# Patient Record
Sex: Female | Born: 1995 | Hispanic: Yes | Marital: Married | Smoking: Never smoker
Health system: Southern US, Community
[De-identification: ages and names within clinical notes are randomized; demographics above are authoritative.]

## PROBLEM LIST (undated history)

## (undated) DIAGNOSIS — O139 Gestational [pregnancy-induced] hypertension without significant proteinuria, unspecified trimester: Secondary | ICD-10-CM

## (undated) DIAGNOSIS — Z789 Other specified health status: Secondary | ICD-10-CM

## (undated) DIAGNOSIS — O0930 Supervision of pregnancy with insufficient antenatal care, unspecified trimester: Secondary | ICD-10-CM

## (undated) HISTORY — DX: Other specified health status: Z78.9

## (undated) HISTORY — PX: NO PAST SURGERIES: SHX2092

---
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
## 2017-01-02 ENCOUNTER — Encounter: Payer: Self-pay | Admitting: Women's Health

## 2017-01-02 ENCOUNTER — Inpatient Hospital Stay (HOSPITAL_COMMUNITY)
Admission: RE | Admit: 2017-01-02 | Discharge: 2017-01-06 | DRG: 774 | Disposition: A | Discharge status: Home / Self Care | Payer: Medicaid Other | Source: Ambulatory Visit | Attending: Family Medicine | Admitting: Family Medicine

## 2017-01-02 ENCOUNTER — Encounter (HOSPITAL_COMMUNITY): Payer: Self-pay | Admitting: *Deleted

## 2017-01-02 ENCOUNTER — Ambulatory Visit (INDEPENDENT_AMBULATORY_CARE_PROVIDER_SITE_OTHER): Payer: Medicaid Other | Admitting: Women's Health

## 2017-01-02 VITALS — BP 136/90 | HR 90 | Wt 241.0 lb

## 2017-01-02 DIAGNOSIS — O0930 Supervision of pregnancy with insufficient antenatal care, unspecified trimester: Secondary | ICD-10-CM

## 2017-01-02 DIAGNOSIS — Z8249 Family history of ischemic heart disease and other diseases of the circulatory system: Secondary | ICD-10-CM

## 2017-01-02 DIAGNOSIS — O135 Gestational [pregnancy-induced] hypertension without significant proteinuria, complicating the puerperium: Secondary | ICD-10-CM | POA: Diagnosis not present

## 2017-01-02 DIAGNOSIS — O134 Gestational [pregnancy-induced] hypertension without significant proteinuria, complicating childbirth: Principal | ICD-10-CM | POA: Diagnosis present

## 2017-01-02 DIAGNOSIS — Z68.41 Body mass index (BMI) pediatric, 85th percentile to less than 95th percentile for age: Secondary | ICD-10-CM | POA: Diagnosis not present

## 2017-01-02 DIAGNOSIS — Z87891 Personal history of nicotine dependence: Secondary | ICD-10-CM | POA: Diagnosis not present

## 2017-01-02 DIAGNOSIS — Z1389 Encounter for screening for other disorder: Secondary | ICD-10-CM

## 2017-01-02 DIAGNOSIS — O139 Gestational [pregnancy-induced] hypertension without significant proteinuria, unspecified trimester: Secondary | ICD-10-CM | POA: Diagnosis present

## 2017-01-02 DIAGNOSIS — Z3403 Encounter for supervision of normal first pregnancy, third trimester: Secondary | ICD-10-CM

## 2017-01-02 DIAGNOSIS — O99214 Obesity complicating childbirth: Secondary | ICD-10-CM | POA: Diagnosis present

## 2017-01-02 DIAGNOSIS — Z3490 Encounter for supervision of normal pregnancy, unspecified, unspecified trimester: Secondary | ICD-10-CM | POA: Diagnosis present

## 2017-01-02 DIAGNOSIS — Z833 Family history of diabetes mellitus: Secondary | ICD-10-CM

## 2017-01-02 DIAGNOSIS — R030 Elevated blood-pressure reading, without diagnosis of hypertension: Secondary | ICD-10-CM

## 2017-01-02 DIAGNOSIS — Z3A40 40 weeks gestation of pregnancy: Secondary | ICD-10-CM

## 2017-01-02 DIAGNOSIS — R079 Chest pain, unspecified: Secondary | ICD-10-CM

## 2017-01-02 DIAGNOSIS — O133 Gestational [pregnancy-induced] hypertension without significant proteinuria, third trimester: Secondary | ICD-10-CM | POA: Diagnosis not present

## 2017-01-02 DIAGNOSIS — Z331 Pregnant state, incidental: Secondary | ICD-10-CM

## 2017-01-02 HISTORY — DX: Supervision of pregnancy with insufficient antenatal care, unspecified trimester: O09.30

## 2017-01-02 HISTORY — DX: Gestational (pregnancy-induced) hypertension without significant proteinuria, unspecified trimester: O13.9

## 2017-01-02 LAB — POCT URINALYSIS DIPSTICK
GLUCOSE UA: NEGATIVE
KETONES UA: NEGATIVE
Leukocytes, UA: NEGATIVE
NITRITE UA: NEGATIVE

## 2017-01-02 LAB — CBC
HCT: 33.3 % — ABNORMAL LOW (ref 36.0–46.0)
Hemoglobin: 11.2 g/dL — ABNORMAL LOW (ref 12.0–15.0)
MCH: 28.6 pg (ref 26.0–34.0)
MCHC: 33.6 g/dL (ref 30.0–36.0)
MCV: 85.2 fL (ref 78.0–100.0)
Platelets: 236 10*3/uL (ref 150–400)
RBC: 3.91 MIL/uL (ref 3.87–5.11)
RDW: 13.7 % (ref 11.5–15.5)
WBC: 8.2 10*3/uL (ref 4.0–10.5)

## 2017-01-02 LAB — COMPREHENSIVE METABOLIC PANEL
ALBUMIN: 2.6 g/dL — AB (ref 3.5–5.0)
ALK PHOS: 213 U/L — AB (ref 38–126)
ALT: 9 U/L — AB (ref 14–54)
AST: 18 U/L (ref 15–41)
Anion gap: 6 (ref 5–15)
BILIRUBIN TOTAL: 0.5 mg/dL (ref 0.3–1.2)
BUN: 7 mg/dL (ref 6–20)
CO2: 24 mmol/L (ref 22–32)
Calcium: 8.9 mg/dL (ref 8.9–10.3)
Chloride: 106 mmol/L (ref 101–111)
Creatinine, Ser: 0.54 mg/dL (ref 0.44–1.00)
GFR calc Af Amer: >60 mL/min (ref >60)
GFR calc non Af Amer: >60 mL/min (ref >60)
Glucose, Bld: 74 mg/dL (ref 65–99)
Potassium: 3.9 mmol/L (ref 3.5–5.1)
SODIUM: 136 mmol/L (ref 135–145)
Total Protein: 6.1 g/dL — ABNORMAL LOW (ref 6.5–8.1)

## 2017-01-02 LAB — PROTEIN / CREATININE RATIO, URINE
Creatinine, Urine: 118 mg/dL
Protein Creatinine Ratio: 4.35 mg/mg{Cre} — ABNORMAL HIGH (ref 0.00–0.15)
Total Protein, Urine: 513 mg/dL

## 2017-01-02 MED ORDER — MISOPROSTOL 25 MCG QUARTER TABLET
25.0000 ug | ORAL_TABLET | ORAL | Status: DC | PRN
  Administered 2017-01-02: 25 ug via ORAL
  Filled 2017-01-02: qty 1

## 2017-01-02 MED ORDER — OXYTOCIN 40 UNITS IN LACTATED RINGERS INFUSION - SIMPLE MED
2.5000 [IU]/h | INTRAVENOUS | Status: DC
  Filled 2017-01-02: qty 1000

## 2017-01-02 MED ORDER — TERBUTALINE SULFATE 1 MG/ML IJ SOLN
0.2500 mg | Freq: Once | INTRAMUSCULAR | Status: DC | PRN
  Filled 2017-01-02: qty 1

## 2017-01-02 MED ORDER — SOD CITRATE-CITRIC ACID 500-334 MG/5ML PO SOLN: 30.0000 mL | ORAL | Status: DC | PRN

## 2017-01-02 MED ORDER — OXYCODONE-ACETAMINOPHEN 5-325 MG PO TABS: 1.0000 | ORAL_TABLET | ORAL | Status: DC | PRN

## 2017-01-02 MED ORDER — LIDOCAINE HCL (PF) 1 % IJ SOLN
30.0000 mL | INTRAMUSCULAR | Status: AC | PRN
  Administered 2017-01-04: 30 mL via SUBCUTANEOUS
  Filled 2017-01-02: qty 30

## 2017-01-02 MED ORDER — FENTANYL CITRATE (PF) 100 MCG/2ML IJ SOLN
100.0000 ug | INTRAMUSCULAR | Status: DC | PRN
  Administered 2017-01-03 – 2017-01-04 (×2): 100 ug via INTRAVENOUS
  Filled 2017-01-02 (×2): qty 2

## 2017-01-02 MED ORDER — LACTATED RINGERS IV SOLN
INTRAVENOUS | Status: DC
  Administered 2017-01-02 – 2017-01-03 (×2): via INTRAVENOUS

## 2017-01-02 MED ORDER — MISOPROSTOL 50MCG HALF TABLET: 50.0000 ug | ORAL_TABLET | ORAL | Status: DC | PRN

## 2017-01-02 MED ORDER — ACETAMINOPHEN 325 MG PO TABS
650.0000 mg | ORAL_TABLET | ORAL | Status: DC | PRN
  Administered 2017-01-04: 650 mg via ORAL
  Filled 2017-01-02: qty 2

## 2017-01-02 MED ORDER — LACTATED RINGERS IV SOLN: 500.0000 mL | INTRAVENOUS | Status: DC | PRN

## 2017-01-02 MED ORDER — OXYCODONE-ACETAMINOPHEN 5-325 MG PO TABS: 2.0000 | ORAL_TABLET | ORAL | Status: DC | PRN

## 2017-01-02 MED ORDER — LACTATED RINGERS IV SOLN
INTRAVENOUS | Status: DC
  Administered 2017-01-03 (×2): via INTRAVENOUS

## 2017-01-02 MED ORDER — OXYTOCIN BOLUS FROM INFUSION
500.0000 mL | Freq: Once | INTRAVENOUS | Status: AC
  Administered 2017-01-04: 500 mL via INTRAVENOUS

## 2017-01-02 MED ORDER — ONDANSETRON HCL 4 MG/2ML IJ SOLN: 4.0000 mg | Freq: Four times a day (QID) | INTRAMUSCULAR | Status: DC | PRN

## 2017-01-02 NOTE — H&P (Signed)
Cindy Middleton is a 21 y.o. female presenting for induction of labor for gestational hypertension . Office note: G1P0 [redacted]w[redacted]d Estimated Date of Delivery: 01/02/17 BP 136/90   Pulse 90   Wt 241 lb (109.3 kg)   LMP 03/28/2016   BMI 42.69 kg/m   BP, weight, and urine reviewed.  Refer to obstetrical flow sheet for FH & FHR.  Reports good fm.  Denies regular uc's, lof, vb, or uti s/s. No complaints. Denies ha, visual changes, ruq/epigastric pain, n/v.   18lb wt gain in last 11 days, BP elevated today @ 136/90, 4+ proteinuria, 3+ BLE edema, DTRs 2+, no clonus Declines SVE Pt to go straight to [facility] for IOL d/t probable pre-e. Notified resident, L&D charge  OB History    Gravida Para Term Preterm AB Living   1             SAB TAB Ectopic Multiple Live Births                 Past Medical History:  Diagnosis Date  . Medical history non-contributory    Past Surgical History:  Procedure Laterality Date  . NO PAST SURGERIES     Family History: family history includes Diabetes in her maternal grandmother and mother; Hypertension in her maternal grandmother. Social History:  reports that she has quit smoking. She has never used smokeless tobacco. She reports that she does not drink alcohol or use drugs.     Maternal Diabetes: No Genetic Screening: Declined Maternal Ultrasounds/Referrals: Normal Fetal Ultrasounds or other Referrals:  None Maternal Substance Abuse:  No Significant Maternal Medications:  None Significant Maternal Lab Results:  Lab values include: Group B Strep negative Other Comments:  None  Review of Systems  Constitutional: Negative for chills, fever and malaise/fatigue.  Eyes: Negative for blurred vision.  Respiratory: Negative for shortness of breath.   Cardiovascular: Positive for leg swelling.  Gastrointestinal: Negative for abdominal pain, constipation, diarrhea, nausea and vomiting.  Neurological: Negative for dizziness, sensory change, speech change and focal  weakness.   Maternal Medical History:  Reason for admission: Nausea. Hypertension, new   Contractions: Frequency: irregular.   Perceived severity is mild.    Fetal activity: Perceived fetal activity is normal.   Last perceived fetal movement was within the past hour.    Prenatal complications: PIH.   No bleeding, preterm labor or substance abuse.   Prenatal Complications - Diabetes: none.    Dilation: Closed Effacement (%): Thick Station: -3 Exam by:: Lytle Michaels RN Blood pressure 133/70, pulse 87, temperature 99 F (37.2 C), temperature source Oral, resp. rate 18, height 5' 3.5" (1.613 m), weight 241 lb (109.3 kg), last menstrual period 03/28/2016. Maternal Exam:  Uterine Assessment: Contraction strength is mild.  Contraction frequency is irregular.   Abdomen: Patient reports no abdominal tenderness. Fundal height is 39.   Estimated fetal weight is 7.5.   Fetal presentation: vertex  Introitus: Normal vulva. Normal vagina.  Ferning test: not done.  Nitrazine test: not done. Amniotic fluid character: not assessed.  Pelvis: adequate for delivery.   Cervix: Cervix evaluated by digital exam.     Fetal Exam Fetal Monitor Review: Mode: ultrasound.   Baseline rate: 135.  Variability: moderate (6-25 bpm).   Pattern: accelerations present and no decelerations.    Fetal State Assessment: Category I - tracings are normal.     Physical Exam  Constitutional: She is oriented to person, place, and time. She appears well-developed and well-nourished. No distress.  HENT:  Head: Normocephalic.  Cardiovascular: Normal rate and regular rhythm.   Respiratory: Effort normal. No respiratory distress. She has no wheezes. She has no rales.  GI: Soft. She exhibits no distension. There is no tenderness. There is no rebound and no guarding.  Genitourinary:  Genitourinary Comments: Dilation: Closed Effacement (%): 40 Cervical Position: Posterior Station: -3,  Ballotable Presentation: Vertex Exam by:: Wynelle BourgeoisMarie Anahi Belmar, CNM   Musculoskeletal: She exhibits edema.  Neurological: She is alert and oriented to person, place, and time. She displays normal reflexes. She exhibits normal muscle tone.  Skin: Skin is warm and dry.  Psychiatric: She has a normal mood and affect.    Prenatal labs: ABO, Rh: --/--/O POS (05/07 1911) Antibody: NEG (05/07 1911) Rubella: 10.10 (01/02 1626) RPR: Non Reactive (02/13 0906)  HBsAg: Negative (01/02 1626)  HIV: Non Reactive (02/13 0906)  GBS: Negative (04/18 1230)   Assessment/Plan: Single IUP at 7530w0d Gestational hypertension  [facility] Routine orders Cytotec for cervical ripening Bishop score = 2    Wynelle BourgeoisMarie Constant [place] 01/02/2017, 9:30 PM

## 2017-01-02 NOTE — Progress Notes (Signed)
Low-risk OB appointment G1P0 2823w0d Estimated Date of Delivery: 01/02/17 BP 136/90   Pulse 90   Wt 241 lb (109.3 kg)   LMP 03/28/2016   BMI 42.69 kg/m   BP, weight, and urine reviewed.  Refer to obstetrical flow sheet for FH & FHR.  Reports good fm.  Denies regular uc's, lof, vb, or uti s/s. No complaints. Denies ha, visual changes, ruq/epigastric pain, n/v.   18lb wt gain in last 11 days, BP elevated today @ 136/90, 4+ proteinuria, 3+ BLE edema, DTRs 2+, no clonus Declines SVE [facility] probable pre-e. Notified resident, L&D charge.  F/U in 1wk for pp bp check, then 4-6wks for pp visit, order nexplanon today

## 2017-01-02 NOTE — Anesthesia Pain Management Evaluation Note (Signed)
  CRNA Pain Management Visit Note  Patient: Cindy Middleton, 21 y.o., female  "Hello [facility]. We have an anesthesia team available at all times to provide care throughout the hospital, including epidural management and anesthesia for C-section. I don't know your plan for the delivery whether it a natural birth, water birth, IV sedation, nitrous supplementation, doula or epidural, but we want to meet your pain goals."   1.Was your pain managed to your expectations on prior hospitalizations?   No prior hospitalizations  2.What is your expectation for pain management during this hospitalization?     IV pain meds  3.How can we help you reach that goal?   Record the patient's initial score and the patient's pain goal.   Pain: 0  Pain Goal: 10 [facility] you to be able to say your pain was always managed very well.  Cindy Middleton,Cindy Middleton 01/02/2017

## 2017-01-03 ENCOUNTER — Inpatient Hospital Stay (HOSPITAL_COMMUNITY): Payer: Medicaid Other | Admitting: Anesthesiology

## 2017-01-03 LAB — RPR: RPR: NONREACTIVE

## 2017-01-03 LAB — CBC
HCT: 34 % — ABNORMAL LOW (ref 36.0–46.0)
Hemoglobin: 11.3 g/dL — ABNORMAL LOW (ref 12.0–15.0)
MCH: 28.3 pg (ref 26.0–34.0)
MCHC: 33.2 g/dL (ref 30.0–36.0)
MCV: 85 fL (ref 78.0–100.0)
Platelets: 224 10*3/uL (ref 150–400)
RBC: 4 MIL/uL (ref 3.87–5.11)
RDW: 13.6 % (ref 11.5–15.5)
WBC: 10.4 10*3/uL (ref 4.0–10.5)

## 2017-01-03 LAB — ABO/RH: ABO/RH(D): O POS

## 2017-01-03 MED ORDER — LACTATED RINGERS IV SOLN
500.0000 mL | Freq: Once | INTRAVENOUS | Status: AC
  Administered 2017-01-03: 500 mL via INTRAVENOUS

## 2017-01-03 MED ORDER — FENTANYL 2.5 MCG/ML BUPIVACAINE 1/10 % EPIDURAL INFUSION (WH - ANES)
14.0000 mL/h | INTRAMUSCULAR | Status: DC | PRN
  Administered 2017-01-03 – 2017-01-04 (×3): 14 mL/h via EPIDURAL
  Filled 2017-01-03 (×3): qty 100

## 2017-01-03 MED ORDER — LACTATED RINGERS IV SOLN: 500.0000 mL | Freq: Once | INTRAVENOUS | Status: AC

## 2017-01-03 MED ORDER — PHENYLEPHRINE 40 MCG/ML (10ML) SYRINGE FOR IV PUSH (FOR BLOOD PRESSURE SUPPORT)
80.0000 ug | PREFILLED_SYRINGE | INTRAVENOUS | Status: DC | PRN
  Filled 2017-01-03: qty 5
  Filled 2017-01-03: qty 10

## 2017-01-03 MED ORDER — PHENYLEPHRINE 40 MCG/ML (10ML) SYRINGE FOR IV PUSH (FOR BLOOD PRESSURE SUPPORT)
80.0000 ug | PREFILLED_SYRINGE | INTRAVENOUS | Status: DC | PRN
  Filled 2017-01-03: qty 5

## 2017-01-03 MED ORDER — EPHEDRINE 5 MG/ML INJ
10.0000 mg | INTRAVENOUS | Status: DC | PRN
  Filled 2017-01-03: qty 2

## 2017-01-03 MED ORDER — LIDOCAINE HCL (PF) 1 % IJ SOLN
INTRAMUSCULAR | Status: DC | PRN
  Administered 2017-01-03 (×2): 6 mL via EPIDURAL

## 2017-01-03 MED ORDER — MISOPROSTOL 50MCG HALF TABLET
50.0000 ug | ORAL_TABLET | ORAL | Status: DC | PRN
  Administered 2017-01-03 (×2): 50 ug via ORAL
  Filled 2017-01-03 (×3): qty 1

## 2017-01-03 MED ORDER — TERBUTALINE SULFATE 1 MG/ML IJ SOLN
0.2500 mg | Freq: Once | INTRAMUSCULAR | Status: DC | PRN
  Filled 2017-01-03: qty 1

## 2017-01-03 MED ORDER — DIPHENHYDRAMINE HCL 50 MG/ML IJ SOLN: 12.5000 mg | INTRAMUSCULAR | Status: DC | PRN

## 2017-01-03 MED ORDER — OXYTOCIN 40 UNITS IN LACTATED RINGERS INFUSION - SIMPLE MED
1.0000 m[IU]/min | INTRAVENOUS | Status: DC
  Administered 2017-01-03: 2 m[IU]/min via INTRAVENOUS
  Filled 2017-01-03: qty 1000

## 2017-01-03 MED ORDER — MISOPROSTOL 25 MCG QUARTER TABLET
25.0000 ug | ORAL_TABLET | ORAL | Status: DC | PRN
  Administered 2017-01-03: 25 ug via VAGINAL
  Filled 2017-01-03 (×2): qty 1

## 2017-01-03 NOTE — Anesthesia Preprocedure Evaluation (Addendum)
Anesthesia Evaluation  Patient identified by MRN, date of birth, ID band Patient awake    Reviewed: Allergy & Precautions, NPO status , Patient's Chart, lab work & pertinent test results  History of Anesthesia Complications Negative for: history of anesthetic complications  Airway Mallampati: IV  TM Distance: >3 FB Neck ROM: Full    Dental  (+) Dental Advisory Given   Pulmonary neg pulmonary ROS, former smoker,    breath sounds clear to auscultation       Cardiovascular hypertension (PIH),  Rhythm:Regular Rate:Normal     Neuro/Psych negative neurological ROS     GI/Hepatic GERD  ,Elevated alk phos   Endo/Other  Morbid obesity  Renal/GU negative Renal ROS     Musculoskeletal   Abdominal   Peds  Hematology negative hematology ROS (+) plt 224k   Anesthesia Other Findings   Reproductive/Obstetrics (+) Pregnancy                            Anesthesia Physical Anesthesia Plan  ASA: III  Anesthesia Plan: Epidural   Post-op Pain Management:    Induction:   Airway Management Planned: Natural Airway  Additional Equipment:   Intra-op Plan:   Post-operative Plan:   Informed Consent: I have reviewed the patients History and Physical, chart, labs and discussed the procedure including the risks, benefits and alternatives for the proposed anesthesia with the patient or authorized representative who has indicated his/her understanding and acceptance.   Dental advisory given  Plan Discussed with:   Anesthesia Plan Comments: (Patient identified. Risks/Benefits/Options discussed with patient including but not limited to bleeding, infection, nerve damage, paralysis, failed block, incomplete pain control, headache, blood pressure changes, nausea, vomiting, reactions to medication both or allergic, itching and postpartum back pain. Confirmed with bedside nurse the patient's most recent platelet  count. Confirmed with patient that they are not currently taking any anticoagulation, have any bleeding history or any family history of bleeding disorders. Patient expressed understanding and wished to proceed. All questions were answered. )        Anesthesia Quick Evaluation

## 2017-01-03 NOTE — Progress Notes (Signed)
Report received from Sandrea HughsKatie Forsell at 4:40 am.

## 2017-01-03 NOTE — Anesthesia Procedure Notes (Signed)
Epidural Patient location during procedure: OB Start time: 01/03/2017 3:41 PM End time: 01/03/2017 3:58 PM  Staffing Anesthesiologist: Jairo BenJACKSON, Leman Martinek Performed: anesthesiologist   Preanesthetic Checklist Completed: patient identified, surgical consent, pre-op evaluation, timeout performed, IV checked, risks and benefits discussed and monitors and equipment checked  Epidural Patient position: sitting Prep: site prepped and draped and DuraPrep Patient monitoring: blood pressure, continuous pulse ox and heart rate Approach: midline Location: L3-L4 Injection technique: LOR air  Needle:  Needle type: Tuohy  Needle gauge: 17 G Needle length: 9 cm Needle insertion depth: 5 cm Catheter type: closed end flexible Catheter size: 19 Gauge Catheter at skin depth: 10.5 cm Test dose: negative (1% lidocaine)  Assessment Events: blood not aspirated, injection not painful, no injection resistance, negative IV test and no paresthesia  Additional Notes Pt identified in Labor room.  Monitors applied. Working IV access confirmed. Sterile prep, drape lumbar spine.  1% lido local L 3,4.  #17ga Touhy LOR air at 5 cm L 3,4, cath in easily to 10.5 cm skin. Test dose OK, cath dosed and infusion begun.  Patient asymptomatic, VSS, no heme aspirated, tolerated well.  Sandford Craze Richar Dunklee, MDReason for block:procedure for pain

## 2017-01-03 NOTE — Progress Notes (Signed)
Cindy Middleton is a 21 y.o. G1P0 at 3540w1d  admitted for induction of labor due to preeclampsia.  Subjective: Patient comfortable with epidural. Patient reports feeling some pressure. Denies any headaches, visual changes or right upper quadrant pain.   Objective: Vitals:   01/03/17 2040 01/03/17 2101 01/03/17 2131 01/03/17 2201  BP: 132/89 (!) 145/92 (!) 149/97 129/82  Pulse: (!) 110 99 100 (!) 108  Resp: 18 20 18 18   Temp:  99.3 F (37.4 C)    TempSrc:  Oral    SpO2:      Weight:      Height:       Total I/O In: 250 [I.V.:250] Out: -   FHT:  FHR: 140s bpm, variability: moderate,  accelerations:  Present,  decelerations:  Absent UC:   regular, every 3-5 minutes SVE:   Dilation: 6 Effacement (%): 90 Station: 0 Exam by:: Straight RN Pitocin @ 6 mu/min  Labs: Lab Results  Component Value Date   WBC 10.4 01/03/2017   HGB 11.3 (L) 01/03/2017   HCT 34.0 (L) 01/03/2017   MCV 85.0 01/03/2017   PLT 224 01/03/2017    Assessment / Plan: Induction of labor due to preeclampsia,  progressing well on pitocin. Will recheck in 2 hours.   Labor: progressing on pitocin Fetal Wellbeing:  Category I Pain Control:  Epidural Anticipated MOD:  NSVD   Cleone SlimCaroline Deon Duer SNM 01/03/2017, 10:34 PM

## 2017-01-03 NOTE — Progress Notes (Signed)
Patient ID: Cindy Middleton, female   DOB: 07/15/1996, 21 y.o.   MRN: [id number] Vitals:   01/02/17 2102 01/02/17 2200 01/02/17 2321 01/03/17 0009  BP: 133/70 (!) 144/86 (!) 147/88 140/77  Pulse: 87 81 87 83  Resp: 18 18 18 18   Temp:  97.6 F (36.4 C)    TempSrc:  Oral    Weight:      Height:        Results for orders placed or performed during the hospital encounter of 01/02/17 (from the past 24 hour(s))  CBC     Status: Abnormal   Collection Time: 01/02/17  7:11 PM  Result Value Ref Range   WBC 8.2 4.0 - 10.5 K/uL   RBC 3.91 3.87 - 5.11 MIL/uL   Hemoglobin 11.2 (L) 12.0 - 15.0 g/dL   HCT 40.933.3 (L) 81.136.0 - 91.446.0 %   MCV 85.2 78.0 - 100.0 fL   MCH 28.6 26.0 - 34.0 pg   MCHC 33.6 30.0 - 36.0 g/dL   RDW 78.213.7 95.611.5 - 21.315.5 %   Platelets 236 150 - 400 K/uL  Type and screen [facility]     Status: None   Collection Time: 01/02/17  7:11 PM  Result Value Ref Range   ABO/RH(D) O POS    Antibody Screen NEG    Sample Expiration 01/05/2017   Comprehensive metabolic panel     Status: Abnormal   Collection Time: 01/02/17  7:11 PM  Result Value Ref Range   Sodium 136 135 - 145 mmol/L   Potassium 3.9 3.5 - 5.1 mmol/L   Chloride 106 101 - 111 mmol/L   CO2 24 22 - 32 mmol/L   Glucose, Bld 74 65 - 99 mg/dL   BUN 7 6 - 20 mg/dL   Creatinine, Ser 0.860.54 0.44 - 1.00 mg/dL   Calcium 8.9 8.9 - 57.810.3 mg/dL   Total Protein 6.1 (L) 6.5 - 8.1 g/dL   Albumin 2.6 (L) 3.5 - 5.0 g/dL   AST 18 15 - 41 U/L   ALT 9 (L) 14 - 54 U/L   Alkaline Phosphatase 213 (H) 38 - 126 U/L   Total Bilirubin 0.5 0.3 - 1.2 mg/dL   GFR calc non Af Amer >60 >60 mL/min   GFR calc Af Amer >60 >60 mL/min   Anion gap 6 5 - 15  Protein / creatinine ratio, urine     Status: Abnormal   Collection Time: 01/02/17  9:50 PM  Result Value Ref Range   Creatinine, Urine 118.00 mg/dL   Total Protein, Urine 513 mg/dL   Protein Creatinine Ratio 4.35 (H) 0.00 - 0.15 mg/mg[Cre]    FHR reactive UCs not tracing  many  Dilation: Closed Effacement (%): 40 Cervical Position: Posterior Station: -3, Ballotable Presentation: Vertex Exam by:: Wynelle BourgeoisMarie Kaitlynn Tramontana, CNM  Will continue cytotec

## 2017-01-04 ENCOUNTER — Inpatient Hospital Stay (HOSPITAL_COMMUNITY): Payer: Medicaid Other

## 2017-01-04 DIAGNOSIS — O135 Gestational [pregnancy-induced] hypertension without significant proteinuria, complicating the puerperium: Secondary | ICD-10-CM

## 2017-01-04 DIAGNOSIS — Z3A40 40 weeks gestation of pregnancy: Secondary | ICD-10-CM

## 2017-01-04 LAB — CBC
HCT: 29 % — ABNORMAL LOW (ref 36.0–46.0)
HCT: 33.4 % — ABNORMAL LOW (ref 36.0–46.0)
HCT: 37.8 % (ref 36.0–46.0)
Hemoglobin: 10 g/dL — ABNORMAL LOW (ref 12.0–15.0)
Hemoglobin: 11.4 g/dL — ABNORMAL LOW (ref 12.0–15.0)
Hemoglobin: 12.8 g/dL (ref 12.0–15.0)
MCH: 28.6 pg (ref 26.0–34.0)
MCH: 28.6 pg (ref 26.0–34.0)
MCH: 29 pg (ref 26.0–34.0)
MCHC: 33.9 g/dL (ref 30.0–36.0)
MCHC: 34.1 g/dL (ref 30.0–36.0)
MCHC: 34.5 g/dL (ref 30.0–36.0)
MCV: 83.9 fL (ref 78.0–100.0)
MCV: 84.1 fL (ref 78.0–100.0)
MCV: 84.4 fL (ref 78.0–100.0)
PLATELETS: 198 10*3/uL (ref 150–400)
PLATELETS: 227 10*3/uL (ref 150–400)
Platelets: 213 10*3/uL (ref 150–400)
RBC: 3.45 MIL/uL — AB (ref 3.87–5.11)
RBC: 3.98 MIL/uL (ref 3.87–5.11)
RBC: 4.48 MIL/uL (ref 3.87–5.11)
RDW: 13.6 % (ref 11.5–15.5)
RDW: 13.7 % (ref 11.5–15.5)
RDW: 13.8 % (ref 11.5–15.5)
WBC: 13.9 10*3/uL — AB (ref 4.0–10.5)
WBC: 24.9 10*3/uL — ABNORMAL HIGH (ref 4.0–10.5)
WBC: 26.2 10*3/uL — AB (ref 4.0–10.5)

## 2017-01-04 LAB — COMPREHENSIVE METABOLIC PANEL
ALBUMIN: 2.7 g/dL — AB (ref 3.5–5.0)
ALT: 9 U/L — ABNORMAL LOW (ref 14–54)
ANION GAP: 10 (ref 5–15)
AST: 18 U/L (ref 15–41)
Alkaline Phosphatase: 234 U/L — ABNORMAL HIGH (ref 38–126)
BUN: 7 mg/dL (ref 6–20)
CHLORIDE: 105 mmol/L (ref 101–111)
CO2: 22 mmol/L (ref 22–32)
Calcium: 8.5 mg/dL — ABNORMAL LOW (ref 8.9–10.3)
Creatinine, Ser: 0.57 mg/dL (ref 0.44–1.00)
GFR calc Af Amer: >60 mL/min (ref >60)
GFR calc non Af Amer: >60 mL/min (ref >60)
GLUCOSE: 80 mg/dL (ref 65–99)
POTASSIUM: 3.6 mmol/L (ref 3.5–5.1)
SODIUM: 137 mmol/L (ref 135–145)
TOTAL PROTEIN: 6.3 g/dL — AB (ref 6.5–8.1)
Total Bilirubin: 1.4 mg/dL — ABNORMAL HIGH (ref 0.3–1.2)

## 2017-01-04 LAB — DIC (DISSEMINATED INTRAVASCULAR COAGULATION) PANEL
APTT: 34 s (ref 24–36)
D DIMER QUANT: 3.54 ug{FEU}/mL — AB (ref 0.00–0.50)
FIBRINOGEN: 541 mg/dL — AB (ref 210–475)
INR: 1.25
PLATELETS: 221 10*3/uL (ref 150–400)
SMEAR REVIEW: NONE SEEN

## 2017-01-04 LAB — DIC (DISSEMINATED INTRAVASCULAR COAGULATION)PANEL: Prothrombin Time: 15.8 seconds — ABNORMAL HIGH (ref 11.4–15.2)

## 2017-01-04 LAB — MASSIVE TRANSFUSION PROTOCOL ORDER (BLOOD BANK NOTIFICATION)

## 2017-01-04 MED ORDER — FENTANYL CITRATE (PF) 100 MCG/2ML IJ SOLN
INTRAMUSCULAR | Status: AC
  Administered 2017-01-04: 100 ug via INTRAVENOUS
  Filled 2017-01-04: qty 4

## 2017-01-04 MED ORDER — TETANUS-DIPHTH-ACELL PERTUSSIS 5-2.5-18.5 LF-MCG/0.5 IM SUSP: 0.5000 mL | Freq: Once | INTRAMUSCULAR | Status: DC

## 2017-01-04 MED ORDER — DIPHENHYDRAMINE HCL 50 MG/ML IJ SOLN
INTRAMUSCULAR | Status: AC
  Filled 2017-01-04: qty 1

## 2017-01-04 MED ORDER — MISOPROSTOL 200 MCG PO TABS: 800.0000 ug | ORAL_TABLET | Freq: Once | ORAL | Status: DC

## 2017-01-04 MED ORDER — PRENATAL MULTIVITAMIN CH
1.0000 | ORAL_TABLET | Freq: Every day | ORAL | Status: DC
  Administered 2017-01-05: 1 via ORAL
  Filled 2017-01-04: qty 1

## 2017-01-04 MED ORDER — ACETAMINOPHEN 325 MG PO TABS: 650.0000 mg | ORAL_TABLET | ORAL | Status: DC | PRN

## 2017-01-04 MED ORDER — FENTANYL CITRATE (PF) 100 MCG/2ML IJ SOLN
100.0000 ug | Freq: Once | INTRAMUSCULAR | Status: AC
  Administered 2017-01-04: 100 ug via INTRAVENOUS

## 2017-01-04 MED ORDER — SENNOSIDES-DOCUSATE SODIUM 8.6-50 MG PO TABS
2.0000 | ORAL_TABLET | ORAL | Status: DC
  Administered 2017-01-05: 2 via ORAL
  Filled 2017-01-04: qty 2

## 2017-01-04 MED ORDER — MISOPROSTOL 200 MCG PO TABS
ORAL_TABLET | ORAL | Status: AC
  Administered 2017-01-04: 1000 ug
  Filled 2017-01-04: qty 5

## 2017-01-04 MED ORDER — ACETAMINOPHEN 500 MG PO TABS
1000.0000 mg | ORAL_TABLET | Freq: Once | ORAL | Status: AC
  Administered 2017-01-04: 1000 mg via ORAL
  Filled 2017-01-04: qty 2

## 2017-01-04 MED ORDER — WITCH HAZEL-GLYCERIN EX PADS: 1.0000 "application " | MEDICATED_PAD | CUTANEOUS | Status: DC | PRN

## 2017-01-04 MED ORDER — GENTAMICIN SULFATE 40 MG/ML IJ SOLN
160.0000 mg | Freq: Three times a day (TID) | INTRAVENOUS | Status: DC
  Administered 2017-01-04: 160 mg via INTRAVENOUS
  Filled 2017-01-04: qty 4

## 2017-01-04 MED ORDER — ZOLPIDEM TARTRATE 5 MG PO TABS: 5.0000 mg | ORAL_TABLET | Freq: Every evening | ORAL | Status: DC | PRN

## 2017-01-04 MED ORDER — METHYLERGONOVINE MALEATE 0.2 MG/ML IJ SOLN: 0.2000 mg | Freq: Once | INTRAMUSCULAR | Status: DC

## 2017-01-04 MED ORDER — DIPHENHYDRAMINE HCL 25 MG PO CAPS: 25.0000 mg | ORAL_CAPSULE | Freq: Four times a day (QID) | ORAL | Status: DC | PRN

## 2017-01-04 MED ORDER — SOD CITRATE-CITRIC ACID 500-334 MG/5ML PO SOLN
ORAL | Status: AC
  Filled 2017-01-04: qty 15

## 2017-01-04 MED ORDER — SIMETHICONE 80 MG PO CHEW
80.0000 mg | CHEWABLE_TABLET | ORAL | Status: DC | PRN
Start: 2017-01-04 — End: 2017-01-06

## 2017-01-04 MED ORDER — COCONUT OIL OIL: 1.0000 "application " | TOPICAL_OIL | Status: DC | PRN

## 2017-01-04 MED ORDER — OXYTOCIN 40 UNITS IN LACTATED RINGERS INFUSION - SIMPLE MED
INTRAVENOUS | Status: AC
  Filled 2017-01-04: qty 1000

## 2017-01-04 MED ORDER — LACTATED RINGERS IV SOLN
INTRAVENOUS | Status: DC
  Administered 2017-01-04 – 2017-01-05 (×2): via INTRAVENOUS

## 2017-01-04 MED ORDER — OXYTOCIN 40 UNITS IN LACTATED RINGERS INFUSION - SIMPLE MED
10.0000 [IU]/h | INTRAVENOUS | Status: DC
  Administered 2017-01-04: 10 [IU]/h via INTRAVENOUS

## 2017-01-04 MED ORDER — ONDANSETRON HCL 4 MG/2ML IJ SOLN: 4.0000 mg | INTRAMUSCULAR | Status: DC | PRN

## 2017-01-04 MED ORDER — METHYLERGONOVINE MALEATE 0.2 MG/ML IJ SOLN
INTRAMUSCULAR | Status: AC
  Filled 2017-01-04: qty 3

## 2017-01-04 MED ORDER — ACETAMINOPHEN 325 MG PO TABS
325.0000 mg | ORAL_TABLET | Freq: Four times a day (QID) | ORAL | Status: DC | PRN
  Administered 2017-01-04: 325 mg via ORAL
  Filled 2017-01-04 (×2): qty 1

## 2017-01-04 MED ORDER — DIPHENHYDRAMINE HCL 50 MG/ML IJ SOLN
25.0000 mg | Freq: Once | INTRAMUSCULAR | Status: AC
Start: 2017-01-04 — End: 2017-01-04
  Administered 2017-01-04: 25 mg via INTRAVENOUS

## 2017-01-04 MED ORDER — CARBOPROST TROMETHAMINE 250 MCG/ML IM SOLN
INTRAMUSCULAR | Status: AC
  Filled 2017-01-04: qty 1

## 2017-01-04 MED ORDER — SODIUM CHLORIDE 0.9 % IV SOLN
2.0000 g | Freq: Four times a day (QID) | INTRAVENOUS | Status: DC
  Administered 2017-01-04: 2 g via INTRAVENOUS
  Filled 2017-01-04 (×2): qty 2000

## 2017-01-04 MED ORDER — BENZOCAINE-MENTHOL 20-0.5 % EX AERO
1.0000 "application " | INHALATION_SPRAY | CUTANEOUS | Status: DC | PRN
  Filled 2017-01-04: qty 56

## 2017-01-04 MED ORDER — ONDANSETRON HCL 4 MG PO TABS: 4.0000 mg | ORAL_TABLET | ORAL | Status: DC | PRN

## 2017-01-04 MED ORDER — DIBUCAINE 1 % RE OINT: 1.0000 "application " | TOPICAL_OINTMENT | RECTAL | Status: DC | PRN

## 2017-01-04 MED ORDER — SODIUM CHLORIDE 0.9 % IV SOLN
3.0000 g | Freq: Four times a day (QID) | INTRAVENOUS | Status: AC
  Administered 2017-01-04 – 2017-01-05 (×4): 3 g via INTRAVENOUS
  Filled 2017-01-04 (×4): qty 3

## 2017-01-04 MED ORDER — SODIUM CHLORIDE 0.9 % IV SOLN
INTRAVENOUS | Status: DC
  Administered 2017-01-04: 16:00:00 via INTRAVENOUS

## 2017-01-04 MED ORDER — IBUPROFEN 600 MG PO TABS
600.0000 mg | ORAL_TABLET | Freq: Four times a day (QID) | ORAL | Status: DC
  Administered 2017-01-04 – 2017-01-06 (×6): 600 mg via ORAL
  Filled 2017-01-04 (×6): qty 1

## 2017-01-04 NOTE — Progress Notes (Signed)
[facility] delivered via vacuum earlier today. When entered the room, patient had notable blood loss.  Patient was tachycardic to 150, BP was wnl. Patient conversant.   Interventions: Second IV placed  Pitocin bag wide open Cytotec 1000mg  buccal LR at 150  Foley placed with UOP 450ml 2 units of pRBC now, 2u on hold q6 hr CBC   Manual removal of clot:  Manual evacuation of uterus performed, bimanual compression of uterus Unasyn ordered  Federico FlakeKimberly Niles Arabia Nylund, MD, MPH, ABFM Attending Physician Faculty Practice- [facility]

## 2017-01-04 NOTE — Progress Notes (Signed)
Subjective: Patient reporting more pain on the right side. Epidural re-dosed and pain resolved.   Objective: BP 130/73   Pulse (!) 115   Temp 98.3 F (36.8 C) (Axillary)   Resp 18   Ht 5' 3.5" (1.613 m)   Wt 241 lb (109.3 kg)   LMP 03/28/2016   SpO2 99%   BMI 42.02 kg/m  Total I/O In: 750 [I.V.:750] Out: 250 [Urine:250]  FHT:  FHR: 140 bpm, variability: moderate,  accelerations:  Present,  decelerations:  Absent UC:   regular, every 2-3 minutes  SVE:   Dilation: 8.5 Effacement (%): 90 Station: +2 Exam by:: Booker CNM  Pitocin @ 5 mu/min  Labs: Lab Results  Component Value Date   WBC 13.9 (H) 01/04/2017   HGB 12.8 01/04/2017   HCT 37.8 01/04/2017   MCV 84.4 01/04/2017   PLT 227 01/04/2017    Assessment / Plan: IUP at term. IOL for preeclampsia. IUPC placed. Patient having periods of tachysystole, pitocin reduced to half and will go back up.   Fetal Wellbeing:  Category I Pain Control:  Epidural Pre-eclampsia: no signs and symptoms, repeat lab work normal. Anticipated MOD:  NSVD  Cleone SlimCaroline Zakai Gonyea SNM 01/04/2017, 2:14 AM

## 2017-01-04 NOTE — Progress Notes (Signed)
2nd unit of PRBC's started at 1852; vss.  Verified with two RNs; Rada HaySydney Flynt, RN and Vivi MartensAshley Deaun Rocha, RN.  1000 mg Acetaminophen was given orally and IV 50mg  Benadryl was given 45 minutes prior to blood administration.

## 2017-01-04 NOTE — Anesthesia Postprocedure Evaluation (Signed)
Anesthesia Post Note  Patient: Cindy BrazenCarol Middleton  Procedure(s) Performed: * No procedures listed *  Patient location during evaluation: Mother Baby Anesthesia Type: Epidural Level of consciousness: awake Pain management: pain level controlled Vital Signs Assessment: post-procedure vital signs reviewed and stable Respiratory status: spontaneous breathing Cardiovascular status: stable Postop Assessment: no headache, no backache, epidural receding, patient able to bend at knees, no signs of nausea or vomiting and adequate PO intake Anesthetic complications: no        Last Vitals:  Vitals:   01/04/17 1121 01/04/17 1145  BP: 130/68 (!) 142/75  Pulse: (!) 108 (!) 112  Resp: 18 18  Temp:  37.1 C    Last Pain:  Vitals:   01/04/17 1145  TempSrc: Oral  PainSc:    Pain Goal: Patients Stated Pain Goal: 10 (01/03/17 0730)               Tyreece Gelles

## 2017-01-04 NOTE — Progress Notes (Signed)
Pharmacy Antibiotic Note  Cindy BrazenCarol Middleton is a 21 y.o. female admitted on 01/02/2017 with induction of labor.  Pharmacy has been consulted for gentamicin dosing.  Plan: Gentamicin 160 mg IV Q8hr   Height: 5' 3.5" (161.3 cm) Weight: 241 lb (109.3 kg) IBW/kg (Calculated) : 53.55 kg Dosing weight: 70 kg  Temp (24hrs), Avg:99 F (37.2 C), Min:98 F (36.7 C), Max:102 F (38.9 C)   Recent Labs Lab 01/02/17 1911 01/03/17 1444 01/04/17 0033  WBC 8.2 10.4 13.9*  CREATININE 0.54  --  0.57    Estimated Creatinine Clearance: 136.6 mL/min (by C-G formula based on SCr of 0.57 mg/dL).    No Known Allergies  Antimicrobials this admission: Ampicillin 5/9 >>  Gentamicin  5/9 >>    Thank you for allowing pharmacy to be a part of this patient's care.  Natasha BenceCline, Sabriah Hobbins 01/04/2017 8:13 AM

## 2017-01-04 NOTE — Progress Notes (Signed)
Carron BrazenCarol Dudash is a 21 y.o. G1P0 at 5756w2d by LMP admitted for induction of labor due to preeclampsia.  Subjective: Asked to see patient - was having chest pain radiating into back with difficulty breathing. Now completely resolved. Was sleeping when occurred - woke her up. Denies anxiety hx. Did have a low BP during episode, now 140s/90s. CP lasted for a 10-15 minutes. Denies difficulty breathing now.   Objective: BP 105/74 (BP Location: Left Arm)   Pulse (!) 114   Temp 98.3 F (36.8 C) (Axillary)   Resp 18   Ht 5' 3.5" (1.613 m)   Wt 241 lb (109.3 kg)   LMP 03/28/2016   SpO2 99%   BMI 42.02 kg/m  I/O last 3 completed shifts: In: 1056.3 [I.V.:1056.3] Out: 250 [Urine:250] Total I/O In: 625 [I.V.:625] Out: 250 [Urine:250]  FHT:  FHR: 145 bpm, variability: moderate,  accelerations:  Present,  decelerations:  Absent UC:   regular, every 2-3 minutes SVE:   Dilation: 8 Effacement (%): 90 Station: +1 Exam by:: [place]. [place] Student CNM  Labs: Lab Results  Component Value Date   WBC 10.4 01/03/2017   HGB 11.3 (L) 01/03/2017   HCT 34.0 (L) 01/03/2017   MCV 85.0 01/03/2017   PLT 224 01/03/2017    Assessment / Plan: Unsure of what her chest pain is related to. ? Anxiety, episode of low BP. No evidence of continued cardiopulmonary disease at this point. Will continue with induction cautiously and monitor vitals and EFM closely. Continue with pitocin.   Repeat CBC, CMP.    Levie HeritageJacob J Stinson 01/04/2017, 12:36 AM

## 2017-01-04 NOTE — Lactation Note (Signed)
This note was copied from a baby's chart. Lactation Consultation Note  Patient Name: Girl Carron BrazenCarol Nick IONGE'XToday's Date: 01/04/2017  [facility] day RN, sydney, who reports mom had post partum hemorrhage  with 3300EBL and is getting second unit of blood transfusing now.  LC advised to night RN, Corrie DandyMary to have mom begin pumping if she is ready to protect milk supply especially if supplementing.  Mary plans to work with hand expression and spoon feeding as needed tonight. Baby has had some breast feedings and then was supplemented with formula in a bottle. LC attempted visit at 10 hours of age.  Family asleep in room, LC did not disturb at this time.       Maternal Data    Feeding Feeding Type: Formula Nipple Type: Slow - flow  LATCH Score/Interventions                      Lactation Tools Discussed/Used     Consult Status      Izola Teague, Arvella MerlesJana Lynn 01/04/2017, 8:23 PM

## 2017-01-04 NOTE — Progress Notes (Signed)
Subjective: Patient feeling pressure and pushing.   Objective: BP (!) 150/78 (BP Location: Left Arm) Comment: patient pushing  Pulse (!) 135   Temp 98.8 F (37.1 C) (Oral)   Resp 18   Ht 5' 3.5" (1.613 m)   Wt 241 lb (109.3 kg)   LMP 03/28/2016   SpO2 99%   BMI 42.02 kg/m  Total I/O In: 750 [I.V.:750] Out: 250 [Urine:250]  FHT:  FHR: 150 bpm, variability: moderate,  accelerations:  Present,  decelerations:  Present variable UC:   regular, every 1-2 minutes  SVE:   Dilation: 10 Effacement (%): 100 Station: +2, +3 Exam by:: Randi Poullard CNM-Student  Pitocin @ 5 mu/min  Labs: Lab Results  Component Value Date   WBC 13.9 (H) 01/04/2017   HGB 12.8 01/04/2017   HCT 37.8 01/04/2017   MCV 84.4 01/04/2017   PLT 227 01/04/2017    Assessment / Plan: IUP at term. IOL for preeclampsia. Patient pushing and getting tired. Will labor down and try pushing again when feeling more pressure.   Fetal Wellbeing:  Category I Pain Control:  Epidural Pre-eclampsia: no signs or symptoms Anticipated MOD:  NSVD  Cleone SlimCaroline Jessieca Rhem SNM 01/04/2017, 5:27 AM

## 2017-01-04 NOTE — Progress Notes (Signed)
Late entry due to patient care.  2352 patient called out to desk asking to see a nurse. RN entered the room 2354 to find patient anxious and stating she was having chest and back pain that woke her up. RN immediately placed pulse ox and auscultated lungs and heart. O2 noted at 99% and HR 100s. CNM on call notified of the patient's symptoms and assessment and came to bedside. Patient's pain was relieved by sitting in high fowlers and deep breathing. Worsened by declining HOB. CNM and Student to bedside and checked patient, also auscultating lungs with benign findings. Dr. Adrian BlackwaterStinson notified of the patient's symptoms by CNM and awaiting further orders. Continuing to monitor patient on EFM and continuous pulse ox. Patient VSS at this time.

## 2017-01-04 NOTE — Progress Notes (Addendum)
1 unit of PRBC's was hung during Patient's PPH at 1552. It was verified by Montez MoritaErin Hampton, RN and Marrion CoyLisa Brewer, RN.  Blood product was not scanned due to emergency.  Unit # [id number] - verified again with Rada HaySydney Flynt, RN   Stop time [phone].

## 2017-01-04 NOTE — Progress Notes (Addendum)
2nd Unit PRBCS completed. Pt asleep, RR=28, pulse 125. BP135/63,T 99.8,lochia scant no clots.

## 2017-01-04 NOTE — Progress Notes (Signed)
I was present with Dr Alvester MorinNewton during a procedure, by Orlan LeavensViria Alvarez, checked on patients meals she states family member will bring food from home.

## 2017-01-04 NOTE — Progress Notes (Signed)
RN was called into room by pt at 1530.  RN walked into room and found pt sitting in blood with multiple large blood clots.  Emergency alarm called and code hemorrhage called.

## 2017-01-04 NOTE — Progress Notes (Signed)
Subjective: Patient pushing.   Objective: Vitals:   01/04/17 0621 01/04/17 0635 01/04/17 0735 01/04/17 0749  BP: (!) 153/93  134/65   Pulse: (!) 134  (!) 153   Resp: 18  18 18   Temp:  99.1 F (37.3 C)    TempSrc:  Axillary    SpO2:      Weight:      Height:        FHT:  FHR: 140s bpm, variability: moderate,  accelerations:  Present,  decelerations:  Present variable UC:   regular, every 1-2 minutes SVE:   Dilation: 10 Effacement (%): 100 Station: +2, +3 Exam by:: Tineshia Becraft CNM-Student Pitocin @ 7 mu/min  Assessment / Plan: IUP at term. IOL for preeclampsia. Patient pushing for 2.5 hours, making some progress with pushes. Genella RifeK. Booker CNM at bedside, discussed risks and benefits of vacuum delivery with patient. Patient wanting to wait and continue pushing without vacuum. Will update MD on progress.   Fetal Wellbeing:  Category II Pain Control:  Epidural Anticipated MOD:  NSVD  Cleone SlimCaroline Ian Castagna SNM 01/04/2017, 7:55 AM

## 2017-01-05 LAB — CBC
HCT: 25.8 % — ABNORMAL LOW (ref 36.0–46.0)
HCT: 25.5 % — ABNORMAL LOW (ref 36.0–46.0)
HEMOGLOBIN: 9 g/dL — AB (ref 12.0–15.0)
Hemoglobin: 9.2 g/dL — ABNORMAL LOW (ref 12.0–15.0)
MCH: 29.5 pg (ref 26.0–34.0)
MCH: 29.4 pg (ref 26.0–34.0)
MCHC: 35.7 g/dL (ref 30.0–36.0)
MCHC: 35.3 g/dL (ref 30.0–36.0)
MCV: 82.7 fL (ref 78.0–100.0)
MCV: 83.3 fL (ref 78.0–100.0)
PLATELETS: 174 10*3/uL (ref 150–400)
PLATELETS: 182 10*3/uL (ref 150–400)
RBC: 3.12 MIL/uL — ABNORMAL LOW (ref 3.87–5.11)
RBC: 3.06 MIL/uL — AB (ref 3.87–5.11)
RDW: 13.9 % (ref 11.5–15.5)
RDW: 14 % (ref 11.5–15.5)
WBC: 21.3 10*3/uL — AB (ref 4.0–10.5)
WBC: 17.6 10*3/uL — AB (ref 4.0–10.5)

## 2017-01-05 MED ORDER — IBUPROFEN 600 MG PO TABS: 600.0000 mg | ORAL_TABLET | Freq: Four times a day (QID) | ORAL | 0 refills | Status: DC

## 2017-01-05 NOTE — Lactation Note (Signed)
This note was copied from a baby's chart. Lactation Consultation Note Mother reports that BF is going well and denies need for assistance. Explained to her that her supply may be delayed [facility].  Encouraged her to BF prior to offering formula and to ask her RN to assess LATCH at the next feeding. Follow-up tomorrow.  Patient Name: Cindy Middleton ZOXWR'UToday's Date: 01/05/2017 Reason for consult: Follow-up assessment   Maternal Data    Feeding    LATCH Score/Interventions                      Lactation Tools Discussed/Used     Consult Status Consult Status: Follow-up Date: 01/06/17 Follow-up type: In-patient    Soyla DryerJoseph, Dekota Kirlin 01/05/2017, 10:47 PM

## 2017-01-05 NOTE — Discharge Summary (Addendum)
OB Discharge Summary  Patient Name: Cindy BrazenCarol Middleton DOB: 10/05/1995 MRN: [id number]  Date of admission: 01/02/2017 Delivering MD: Lyndel SafeNEWTON, KIMBERLY NILES   Date of discharge: 01/05/2017  Admitting diagnosis: 40wks induction  Intrauterine pregnancy: 6574w3d     Secondary diagnosis:Active Problems:   Encounter for induction of labor   Gestational hypertension  Additional problems:morbid obesity, teen pregnancy     Discharge diagnosis: Term Pregnancy Delivered and Gestational Hypertension                                                                     Post partum procedures:blood transfusion  Augmentation: AROM, Pitocin and Cytotec  Complications: Postpartum hemorrhage  Hospital course:  Induction of Labor With Vaginal Delivery   [age] y.o. yo G1P0 at 7474w3d was admitted to the hospital 01/02/2017 for induction of labor.  Indication for induction: Gestational hypertension.  Patient had an uncomplicated labor course as follows: Membrane Rupture Time/Date: 8:45 AM ,01/03/2017   Intrapartum Procedures: Episiotomy: None [1]                                         Lacerations:  2nd degree [3];Perineal [11]  Patient had delivery of a Viable infant.  Information for the patient's newborn:  Jacqualyn PoseySalcedo, Girl Nefertari [[id number]][[id number]]  Delivery Method: Vaginal, Vacuum (Extractor) (Filed from Delivery Summary)   01/04/2017  Details of delivery can be found in separate delivery note.  Patient had a routine postpartum course. Patient is discharged home 01/05/17.  Physical exam  Vitals:   01/04/17 2020 01/05/17 0130 01/05/17 0300 01/05/17 0500  BP: 135/63 (!) 113/58  116/60  Pulse: (!) 125 (!) 106  97  Resp: (!) 28 (!) 26    Temp: 99.8 F (37.7 C)  98.8 F (37.1 C)   TempSrc: Oral  Oral   SpO2: 95% 97%    Weight:      Height:       General: alert Lochia: appropriate Uterine Fundus: firm Incision: N/A DVT Evaluation: No evidence of DVT seen on physical exam. Labs: Lab Results  Component Value  Date   WBC 21.3 (H) 01/05/2017   HGB 9.2 (L) 01/05/2017   HCT 25.8 (L) 01/05/2017   MCV 82.7 01/05/2017   PLT 174 01/05/2017   CMP Latest Ref Rng & Units 01/04/2017  Glucose 65 - 99 mg/dL 80  BUN 6 - 20 mg/dL 7  Creatinine 7.820.44 - 9.561.00 mg/dL 2.130.57  Sodium 086135 - 578145 mmol/L 137  Potassium 3.5 - 5.1 mmol/L 3.6  Chloride 101 - 111 mmol/L 105  CO2 22 - 32 mmol/L 22  Calcium 8.9 - 10.3 mg/dL 4.6(N8.5(L)  Total Protein 6.5 - 8.1 g/dL 6.3(L)  Total Bilirubin 0.3 - 1.2 mg/dL 6.2(X1.4(H)  Alkaline Phos 38 - 126 U/L 234(H)  AST 15 - 41 U/L 18  ALT 14 - 54 U/L 9(L)    Discharge instruction: per After Visit Summary and "Baby and Me Booklet".  After Visit Meds:  Allergies as of 01/05/2017   No Known Allergies     Medication List    TAKE these medications   ibuprofen 600 MG tablet Commonly known as:  ADVIL,MOTRIN Take 1 tablet (600 mg total) by mouth every 6 (six) hours.   Prenatal Vitamins 28-0.8 MG Tabs One a day       Diet: routine diet  Activity: Advance as tolerated. Pelvic rest for 6 weeks.   Outpatient follow up:BP check in a week Follow up Appt:Future Appointments Date Time Provider Department Center  01/09/2017 4:15 PM Cheral Marker, CNM [facility]   Follow up visit: No Follow-up on file.  Postpartum contraception: Nexplanon  Newborn Data: Live born female  Birth Weight: 7 lb 1.4 oz (3215 g) APGAR: 7, 9  Baby Feeding: Breast and bottle Disposition:home with mother pending Peds rounding and approval   01/05/2017 Allie Bossier, MD

## 2017-01-05 NOTE — Discharge Instructions (Signed)

## 2017-01-05 NOTE — Progress Notes (Signed)
UR chart review completed.  

## 2017-01-06 LAB — CBC
HEMATOCRIT: 23.9 % — AB (ref 36.0–46.0)
HEMOGLOBIN: 8 g/dL — AB (ref 12.0–15.0)
MCH: 28.7 pg (ref 26.0–34.0)
MCHC: 33.5 g/dL (ref 30.0–36.0)
MCV: 85.7 fL (ref 78.0–100.0)
Platelets: 201 10*3/uL (ref 150–400)
RBC: 2.79 MIL/uL — AB (ref 3.87–5.11)
RDW: 14.2 % (ref 11.5–15.5)
WBC: 14 10*3/uL — ABNORMAL HIGH (ref 4.0–10.5)

## 2017-01-06 LAB — TYPE AND SCREEN
ABO/RH(D): O POS
Antibody Screen: NEGATIVE
UNIT DIVISION: 0
UNIT DIVISION: 0
UNIT DIVISION: 0
UNIT DIVISION: 0
Unit division: 0
Unit division: 0
Unit division: 0
Unit division: 0

## 2017-01-06 LAB — BPAM RBC
BLOOD PRODUCT EXPIRATION DATE: 201805302359
BLOOD PRODUCT EXPIRATION DATE: 201805302359
BLOOD PRODUCT EXPIRATION DATE: 201806052359
Blood Product Expiration Date: 201805242359
Blood Product Expiration Date: 201805302359
Blood Product Expiration Date: 201806052359
Blood Product Expiration Date: 201806052359
Blood Product Expiration Date: 201806052359
ISSUE DATE / TIME: 201805091536
ISSUE DATE / TIME: 201805091545
ISSUE DATE / TIME: 201805091545
ISSUE DATE / TIME: 201805091545
UNIT TYPE AND RH: 5100
UNIT TYPE AND RH: 5100
UNIT TYPE AND RH: 5100
UNIT TYPE AND RH: 5100
UNIT TYPE AND RH: 5100
Unit Type and Rh: 5100
Unit Type and Rh: 5100
Unit Type and Rh: 5100

## 2017-01-06 NOTE — Discharge Summary (Signed)
OB Discharge Summary  Patient Name: Cindy Middleton DOB: Oct 17, 1995 MRN: [id number]  Date of admission: 01/02/2017 Delivering MD: Lyndel Safe NILES   Date of discharge: 01/06/2017  Admitting diagnosis: 40wks induction  Intrauterine pregnancy: [redacted]w[redacted]d     Secondary diagnosis:Active Problems:   Encounter for induction of labor   Gestational hypertension  Additional problems:morbid obesity, teen pregnancy     Discharge diagnosis: Term Pregnancy Delivered and Gestational Hypertension                                                                     Post partum procedures:blood transfusion  Augmentation: AROM, Pitocin and Cytotec  Complications: Postpartum hemorrhage  Hospital course:  Induction of Labor With Vaginal Delivery   21 y.o. yo G1P0 at [redacted]w[redacted]d was admitted to the hospital 01/02/2017 for induction of labor.  Indication for induction: Gestational hypertension.  Patient had an uncomplicated labor course as follows: Membrane Rupture Time/Date: 8:45 AM ,01/03/2017   Intrapartum Procedures: Episiotomy: None [1]                                         Lacerations:  2nd degree [3];Perineal [11]  Patient had delivery of a Viable infant.  Information for the patient's newborn:  Anieya, Helman [[id number]]  Delivery Method: Vaginal, Vacuum (Extractor) (Filed from Delivery Summary)   01/04/2017  Details of delivery can be found in separate delivery note.  Patient had a routine postpartum course. Patient is discharged home 01/05/17.  Physical exam  Vitals:   01/04/17 2020 01/05/17 0130 01/05/17 0300 01/05/17 0500  BP: 135/63 (!) 113/58  116/60  Pulse: (!) 125 (!) 106  97  Resp: (!) 28 (!) 26    Temp: 99.8 F (37.7 C)  98.8 F (37.1 C)   TempSrc: Oral  Oral   SpO2: 95% 97%    Weight:      Height:       General: alert Lochia: appropriate Uterine Fundus: firm Incision: N/A DVT Evaluation: No evidence of DVT seen on physical exam. Labs: Lab Results  Component Value  Date   WBC 21.3 (H) 01/05/2017   HGB 9.2 (L) 01/05/2017   HCT 25.8 (L) 01/05/2017   MCV 82.7 01/05/2017   PLT 174 01/05/2017   CMP Latest Ref Rng & Units 01/04/2017  Glucose 65 - 99 mg/dL 80  BUN 6 - 20 mg/dL 7  Creatinine 7.82 - 9.56 mg/dL 2.13  Sodium 086 - 578 mmol/L 137  Potassium 3.5 - 5.1 mmol/L 3.6  Chloride 101 - 111 mmol/L 105  CO2 22 - 32 mmol/L 22  Calcium 8.9 - 10.3 mg/dL 4.6(N)  Total Protein 6.5 - 8.1 g/dL 6.3(L)  Total Bilirubin 0.3 - 1.2 mg/dL 6.2(X)  Alkaline Phos 38 - 126 U/L 234(H)  AST 15 - 41 U/L 18  ALT 14 - 54 U/L 9(L)    Discharge instruction: per After Visit Summary and "Baby and Me Booklet".  After Visit Meds:  Allergies as of 01/05/2017   No Known Allergies     Medication List    TAKE these medications   ibuprofen 600 MG tablet Commonly known as:  ADVIL,MOTRIN Take 1 tablet (600 mg total) by mouth every 6 (six) hours.   Prenatal Vitamins 28-0.8 MG Tabs One a day       Diet: routine diet  Activity: Advance as tolerated. Pelvic rest for 6 weeks.   Outpatient follow up:BP check in a week Follow up Appt:Future Appointments Date Time Provider Department Center  01/09/2017 4:15 PM Cheral MarkerBooker, Kimberly R, CNM [facility]   Follow up visit: No Follow-up on file.  Postpartum contraception: Nexplanon  Newborn Data: Live born female  Birth Weight: 7 lb 1.4 oz (3215 g) APGAR: 7, 9  Baby Feeding: Breast and bottle Disposition:home with mother pending Peds rounding and approval   01/06/2017 Allie BossierMyra C Bentlie Catanzaro, MD

## 2017-01-06 NOTE — Lactation Note (Addendum)
This note was copied from a baby's chart. Lactation Consultation Note  Offered interpreter but mother stated she did not need interpretation. P1, Baby 41 hours old.  Mother hand expressed glistening.  Mother is BR/FO. Baby cueing in crib. Mother latched baby in cradle hold.  LC provided pillow for support. Sucks and swallows observed.  Reviewed breast compression during feeding to keep baby active.  Mother needed review on support and positioning but infant latches easily. Discussed supply and demand.  Encouraged mother to bf on both breasts before offering formula. Reviewed engorgement care and monitoring voids/stools. Mom encouraged to feed baby 8-12 times/24 hours and with feeding cues.  Provided mother w/ manual pump.   Patient Name: Cindy Carron BrazenCarol Middleton ZOXWR'UToday's Date: 01/06/2017     Maternal Data    Feeding    LATCH Score/Interventions                      Lactation Tools Discussed/Used     Consult Status      Cindy Middleton, Cindy Middleton 01/06/2017, 9:13 AM

## 2017-01-07 LAB — PREPARE FRESH FROZEN PLASMA
UNIT DIVISION: 0
UNIT DIVISION: 0

## 2017-01-07 LAB — BPAM FFP
Blood Product Expiration Date: 201805262359
Blood Product Expiration Date: 201805262359
UNIT TYPE AND RH: 6200
Unit Type and Rh: 6200

## 2017-01-07 LAB — BPAM PLATELET PHERESIS
Blood Product Expiration Date: 201805112359
Unit Type and Rh: 6200

## 2017-01-07 LAB — PREPARE PLATELET PHERESIS

## 2017-01-08 ENCOUNTER — Encounter: Payer: Self-pay | Admitting: Family Medicine

## 2017-01-09 ENCOUNTER — Ambulatory Visit: Payer: Medicaid Other | Admitting: Women's Health

## 2017-02-07 ENCOUNTER — Encounter: Payer: Self-pay | Admitting: Obstetrics & Gynecology

## 2017-03-27 ENCOUNTER — Telehealth: Payer: Self-pay | Admitting: Women's Health

## 2017-04-04 ENCOUNTER — Ambulatory Visit: Payer: Medicaid Other | Admitting: Women's Health

## 2017-04-13 ENCOUNTER — Encounter: Payer: Self-pay | Admitting: Advanced Practice Midwife

## 2017-04-13 ENCOUNTER — Ambulatory Visit (INDEPENDENT_AMBULATORY_CARE_PROVIDER_SITE_OTHER): Payer: Self-pay | Admitting: Advanced Practice Midwife

## 2017-04-13 ENCOUNTER — Ambulatory Visit (INDEPENDENT_AMBULATORY_CARE_PROVIDER_SITE_OTHER): Payer: Self-pay | Admitting: *Deleted

## 2017-04-13 ENCOUNTER — Encounter: Payer: Self-pay | Admitting: *Deleted

## 2017-04-13 VITALS — BP 122/74 | HR 95 | Ht 63.0 in | Wt 212.0 lb

## 2017-04-13 DIAGNOSIS — Z3202 Encounter for pregnancy test, result negative: Secondary | ICD-10-CM

## 2017-04-13 DIAGNOSIS — Z3042 Encounter for surveillance of injectable contraceptive: Secondary | ICD-10-CM

## 2017-04-13 DIAGNOSIS — Z308 Encounter for other contraceptive management: Secondary | ICD-10-CM

## 2017-04-13 DIAGNOSIS — Z30013 Encounter for initial prescription of injectable contraceptive: Secondary | ICD-10-CM

## 2017-04-13 LAB — POCT URINE PREGNANCY: PREG TEST UR: NEGATIVE

## 2017-04-13 MED ORDER — MEDROXYPROGESTERONE ACETATE 150 MG/ML IM SUSP
150.0000 mg | Freq: Once | INTRAMUSCULAR | Status: AC
  Administered 2017-04-13: 150 mg via INTRAMUSCULAR

## 2017-04-13 MED ORDER — MEDROXYPROGESTERONE ACETATE 150 MG/ML IM SUSP: 150.0000 mg | INTRAMUSCULAR | 3 refills | Status: AC

## 2017-04-13 NOTE — Progress Notes (Signed)
[facility] Visit  Patient name: Cindy Middleton MRN [id number]  Date of birth: 08/18/1996  CC & HPI:  Cindy Middleton is a 21 y.o. Hispanic female presenting today for birth control. She had a baby in May, has not had I ntercourse since then. Breastfed up until a month ago, still hasn't had a period.  Discussed BC options, including SE/risks/benefits.  Wants to do depo.   Pertinent History Reviewed:  Medical & Surgical Hx:   Past Medical History:  Diagnosis Date  . Gestational hypertension   . Late prenatal care   . Medical history non-contributory    Past Surgical History:  Procedure Laterality Date  . NO PAST SURGERIES     Family History  Problem Relation Age of Onset  . Diabetes Mother   . Diabetes Maternal Grandmother   . Hypertension Maternal Grandmother     Current Outpatient Prescriptions:  .  medroxyPROGESTERone (DEPO-PROVERA) 150 MG/ML injection, Inject 1 mL (150 mg total) into the muscle every 3 (three) months., Disp: 1 mL, Rfl: 3 Social History: Reviewed -  reports that she has quit smoking. She has never used smokeless tobacco.  Review of Systems:   Constitutional: Negative for fever and chills Eyes: Negative for visual disturbances Respiratory: Negative for shortness of breath, dyspnea Cardiovascular: Negative for chest pain or palpitations  Gastrointestinal: Negative for vomiting, diarrhea and constipation; no abdominal pain Genitourinary: Negative for dysuria and urgency, vaginal irritation or itching Musculoskeletal: Negative for back pain, joint pain, myalgias  Neurological: Negative for dizziness and headaches    Objective Findings:    Physical Examination: General appearance - well appearing, and in no distress Mental status - alert, oriented to person, place, and time Chest:  Normal respiratory effort Heart - normal rate and regular rhythm Pelvic: deferred Musculoskeletal:  Normal range of motion without pain Extremities:  No edema  50%  or more of this visit was spent in counseling and coordination of care.  15 minutes of face to face time.   Results for orders placed or performed in visit on 04/13/17 (from the past 24 hour(s))  POCT urine pregnancy   Collection Time: 04/13/17  1:46 PM  Result Value Ref Range   Preg Test, Ur Negative Negative      Assessment & Plan:  A:   Contraception management P:  Pt made aware that she could proalby save a lot of m oney if she went [facility]; states that she doesn't mind paying   Return for will come back later for depo injection.  CRESENZO-DISHMAN,Siniya Lichty CNM 04/13/2017 4:36 PM

## 2017-04-13 NOTE — Patient Instructions (Signed)
May be able to get birth control at a reduced (or even free) price at the [facility] in [place].   Informacin sobre los anticonceptivos hormonales (Hormonal Contraception Information) El estrgeno y la progesterona (progestina) son hormonas usadas en muchas formas de control de natalidad (contracepcin). Estas 2 hormonas se encuentran en la mayora de los anticonceptivos hormonales. Los anticonceptivos hormonales utilizan:   Una combinacin de las hormonas estrgeno y progesterona en una de estas formas: ? Pldoras: vienen en varias combinaciones de pldoras con hormona activa y pldoras no hormonales. Las Abbott Laboratoriesdiferentes combinaciones pueden provocar que tenga el perodo una vez por mes, una vez cada 3 meses o que no tenga su perodo. Es importante que las tome a la misma hora todos [place] das. ? El parche: se coloca en la parte inferior del abdomen todas las semanas durante 3 semanas. No se coloca en la cuarta semana. ? Anillo vaginal: se coloca en la vagina y se deja all durante 3 semanas. Luego se retira por una semana.  Progesterona sola en forma de: ? Pldoras: las pldoras hormonales se toman todos los [address]  [address] del [place]. ? El dispositivo intrauterino (DIU): se inserta durante un perodo menstrual y se retira o se reemplaza cada 5 aos o menos. ? Implante: Se colocan unas varillas plsticas debajo de la piel, en la zona superior del brazo. Se retiran o se reemplazan cada 3 aos o menos. ? Inyeccin: se aplica una vez cada [address]90 das. Puede producirse un embarazo con cualquiera de estos mtodos anticonceptivos hormonales. Si sospecha que est embarazada, hgase un test de embarazo y converse con su mdico.  ANTICONCEPTIVOS DE ESTRGENO Y PROGESTERONA Los anticonceptivos de estrgeno y progesterona pueden evitar un embarazo:  Impidiendo la liberacin de un vulo (ovulacin).  Espesando el moco cervical lo que dificulta la entrada de los espermatozoides al  tero.  Modificando el revestimiento interno del tero. Esta modificacin hace que sea ms difcil que se implante el huevo. Los efectos secundarios de los estrgenos son ms frecuentes en los primeros 2 o 3 meses. Hable con su mdico Lockheed Martinsobre los efectos secundarios que pueden afectarla. Si usted tiene Calpine Corporationefectos secundarios persistentes o son graves, hable con su mdico. ANTICONCEPTIVOS DE PROGESTERONA Los anticonceptivos que solo contienen progesterona pueden evitar el embarazo a travs de:   El bloqueo de la ovulacin. Esto se produce en muchas mujeres, pero algunas continuarn ovulando.   Impidiendo la entrada de los espermatozoides en el tero al mantener el moco cervical espeso y pegajoso.   Modificando el revestimiento interno del tero. Este cambio hace que sea ms difcil que se implante el huevo.  Los efectos secundarios de la progesterona pueden variar. Hable con su mdico Lockheed Martinsobre los efectos secundarios que pueden afectarla. Si usted tiene Calpine Corporationefectos secundarios persistentes o son graves, hable con su mdico.  Esta informacin no tiene Theme park managercomo fin reemplazar el consejo del mdico. Asegrese de hacerle al mdico cualquier pregunta que tenga. Document Released: 05/09/2012 Document Revised: 04/17/2013 Elsevier Interactive Patient Education  2017 [employer]

## 2017-04-13 NOTE — Progress Notes (Signed)
Pt here for Depo. Pt tolerated shot well. Return in 12 weeks for next shot. JSY 

## 2017-07-06 ENCOUNTER — Encounter: Payer: Self-pay | Admitting: *Deleted

## 2017-07-06 ENCOUNTER — Ambulatory Visit: Payer: Self-pay

## 2018-05-08 ENCOUNTER — Encounter (HOSPITAL_COMMUNITY): Payer: Self-pay | Admitting: Emergency Medicine

## 2018-05-08 ENCOUNTER — Emergency Department (HOSPITAL_COMMUNITY)
Admission: EM | Admit: 2018-05-08 | Discharge: 2018-05-08 | Disposition: A | Discharge status: Home / Self Care | Payer: Self-pay | Attending: Emergency Medicine | Admitting: Emergency Medicine

## 2018-05-08 DIAGNOSIS — Z79899 Other long term (current) drug therapy: Secondary | ICD-10-CM | POA: Insufficient documentation

## 2018-05-08 DIAGNOSIS — Z87891 Personal history of nicotine dependence: Secondary | ICD-10-CM | POA: Insufficient documentation

## 2018-05-08 DIAGNOSIS — T7840XA Allergy, unspecified, initial encounter: Secondary | ICD-10-CM | POA: Insufficient documentation

## 2018-05-08 MED ORDER — PREDNISONE 10 MG PO TABS: ORAL_TABLET | ORAL | 0 refills | Status: AC

## 2018-05-08 NOTE — Discharge Instructions (Signed)
Take claritin during the day as discussed and benadryl at night

## 2018-05-08 NOTE — ED Provider Notes (Signed)
[facility]   CSN: [id number] Arrival date & time: 05/08/18  1737     History   Chief Complaint Chief Complaint  Patient presents with  . Pruritis    HPI Cindy Middleton is a 22 y.o. female.  Pt comes in with c/o continued itching that occurred after a reaction to seafood 7 days ago. She states that she has tried benadryl and it helps for a short time and then it gets bad again. She states that she has also had a rash on her arms. Denies problems breathing or swallowing     Past Medical History:  Diagnosis Date  . Gestational hypertension   . Late prenatal care   . Medical history non-contributory     Patient Active Problem List   Diagnosis Date Noted  . Postpartum hemorrhage 01/08/2017  . Encounter for induction of labor 01/02/2017  . Gestational hypertension 01/02/2017    Past Surgical History:  Procedure Laterality Date  . NO PAST SURGERIES       OB History    Gravida  1   Para  1   Term  1   Preterm      AB      Living  1     SAB      TAB      Ectopic      Multiple      Live Births  1            Home Medications    Prior to Admission medications   Medication Sig Start Date End Date Taking? Authorizing Provider  medroxyPROGESTERone (DEPO-PROVERA) 150 MG/ML injection Inject 1 mL (150 mg total) into the muscle every 3 (three) months. 04/13/17   Jacklyn Shell, CNM    Family History Family History  Problem Relation Age of Onset  . Diabetes Mother   . Diabetes Maternal Grandmother   . Hypertension Maternal Grandmother     Social History Social History   Tobacco Use  . Smoking status: Former Games developer  . Smokeless tobacco: Never Used  Substance Use Topics  . Alcohol use: No  . Drug use: No     Allergies   Patient has no known allergies.   Review of Systems Review of Systems  All other systems reviewed and are negative.    Physical Exam Updated Vital  Signs BP 137/79 (BP Location: Right Arm)   Pulse (!) 117   Temp 98.3 F (36.8 C) (Oral)   Resp 18   LMP 05/05/2018   SpO2 97%   Physical Exam  Constitutional: She appears well-developed and well-nourished.  HENT:  Head: Normocephalic and atraumatic.  No oral swelling  Neck: Normal range of motion. Neck supple.  Cardiovascular: Normal rate and regular rhythm.  Pulmonary/Chest: Effort normal and breath sounds normal.  Musculoskeletal: Normal range of motion.  Neurological: She is alert.  Skin:  Macular papular rash to bilateral forearms  Psychiatric: She has a normal mood and affect.     ED Treatments / Results  Labs (all labs ordered are listed, but only abnormal results are displayed) Labs Reviewed - No data to display  EKG None  Radiology No results found.  Procedures Procedures (including critical care time)  Medications Ordered in ED Medications - No data to display   Initial Impression / Assessment and Plan / ED Course  I have reviewed the triage vital signs and the nursing notes.  Pertinent labs & imaging results that were available during my  care of the patient were reviewed by me and considered in my medical decision making (see chart for details).     Pt not in any acute distress. Pt is okay to follow up for continued symptoms. Will put on steroids  Final Clinical Impressions(s) / ED Diagnoses   Final diagnoses:  Allergic reaction, initial encounter    ED Discharge Orders    None       Teressa Lower, NP 05/08/18 1817    Melene Plan, DO 05/08/18 2348

## 2018-05-08 NOTE — ED Triage Notes (Signed)
Pt reports itching for 14-15 days after eating seafood soup. Pt reports her husband got her Benadryl and helps when she takes it.

## 2018-07-04 IMAGING — CR DG CHEST 1V PORT
1 series · 1 of 1 positions shown · non-contrast
Comparison: None.

CLINICAL DATA: 20 y/o F; pregnant patient ate labored with chest
pain.

EXAM:
PORTABLE CHEST 1 VIEW

[chest]
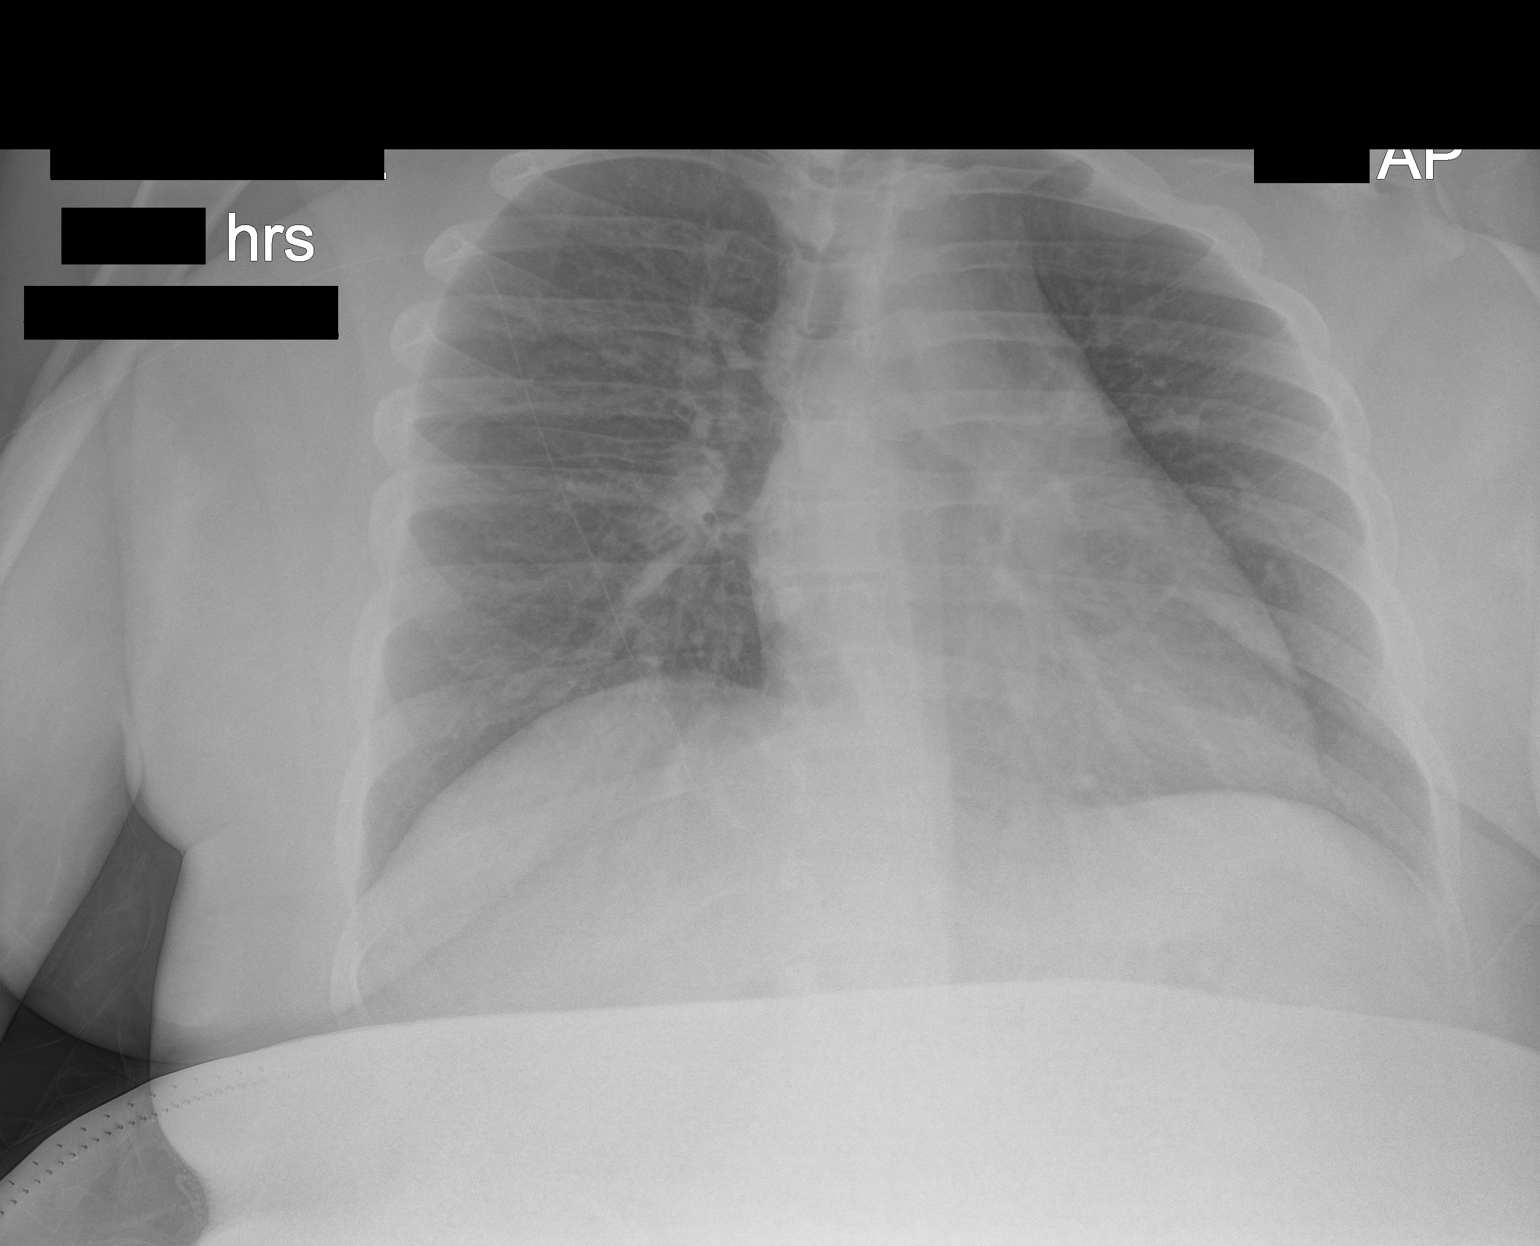

[1 of 1 positions shown; findings below may reference images not displayed]

FINDINGS: Normal heart size given projection and technique. Prominent
pulmonary vascularity compatible with hyperdynamic state of
pregnancy. No focal consolidation, pneumothorax, or effusion. Bones
are unremarkable.
IMPRESSION: No acute pulmonary process identified.

By: Fhuemaus Labocha M.D.

## 2019-08-16 ENCOUNTER — Encounter (HOSPITAL_COMMUNITY): Payer: Self-pay | Admitting: Emergency Medicine

## 2019-08-16 ENCOUNTER — Emergency Department (HOSPITAL_COMMUNITY)
Admission: EM | Admit: 2019-08-16 | Discharge: 2019-08-16 | Disposition: A | Discharge status: Home / Self Care | Payer: Self-pay | Attending: Emergency Medicine | Admitting: Emergency Medicine

## 2019-08-16 ENCOUNTER — Other Ambulatory Visit: Payer: Self-pay

## 2019-08-16 DIAGNOSIS — Z87891 Personal history of nicotine dependence: Secondary | ICD-10-CM | POA: Insufficient documentation

## 2019-08-16 DIAGNOSIS — R519 Headache, unspecified: Secondary | ICD-10-CM | POA: Insufficient documentation

## 2019-08-16 DIAGNOSIS — Z79899 Other long term (current) drug therapy: Secondary | ICD-10-CM | POA: Insufficient documentation

## 2019-08-16 MED ORDER — BUTALBITAL-APAP-CAFFEINE 50-325-40 MG PO TABS: 1.0000 | ORAL_TABLET | Freq: Four times a day (QID) | ORAL | 0 refills | Status: AC | PRN

## 2019-08-16 NOTE — ED Provider Notes (Signed)
[facility] DEPT Provider Note   CSN: [id number] Arrival date & time: 08/16/19  1558     History Chief Complaint  Patient presents with  . Headache    Cindy Middleton is a 23 y.o. female.  The history is provided by the patient. No language interpreter was used.  Headache      23 year old female without significant past medical history presenting for evaluation of headache.  Patient endorsed gradual headache for the past 6 days.  She described headache as a throbbing sensation across her forehead and to the top of the head, persistent, more noticeable when she lays down and improves when she moves around.  Rates pain as 4 out of 10.  Not adequately managed with over-the-counter Tylenol ibuprofen.  Patient denies any associated fever, chills, runny nose sneezing coughing loss of taste or smell, neck stiffness, chest pain shortness of breath abdominal pain focal numbness or weakness or rash.  She denies increasing stress.  She denies light or sound sensitivity.  She recently got tested for COVID-19 3 days ago and it was negative.  Past Medical History:  Diagnosis Date  . Gestational hypertension   . Late prenatal care   . Medical history non-contributory     Patient Active Problem List   Diagnosis Date Noted  . Postpartum hemorrhage 01/08/2017  . Encounter for induction of labor 01/02/2017  . Gestational hypertension 01/02/2017    Past Surgical History:  Procedure Laterality Date  . NO PAST SURGERIES       OB History    Gravida  1   Para  1   Term  1   Preterm      AB      Living  1     SAB      TAB      Ectopic      Multiple      Live Births  1           Family History  Problem Relation Age of Onset  . Diabetes Mother   . Diabetes Maternal Grandmother   . Hypertension Maternal Grandmother     Social History   Tobacco Use  . Smoking status: Former Research scientist (life sciences)  . Smokeless tobacco: Never Used  Substance Use Topics   . Alcohol use: No  . Drug use: No    Home Medications Prior to Admission medications   Medication Sig Start Date End Date Taking? Authorizing Provider  medroxyPROGESTERone (DEPO-PROVERA) 150 MG/ML injection Inject 1 mL (150 mg total) into the muscle every 3 (three) months. 04/13/17   Cresenzo-Dishmon, Joaquim Lai, CNM  predniSONE (DELTASONE) 10 MG tablet 6 day taper 05/08/18   Glendell Docker, NP    Allergies    Patient has no known allergies.  Review of Systems   Review of Systems  Neurological: Positive for headaches.  All other systems reviewed and are negative.   Physical Exam Updated Vital Signs BP (!) 127/91 (BP Location: Right Arm)   Pulse (!) 102   Temp 98.5 F (36.9 C) (Oral)   Resp 18   Ht 5\' 3"  (1.6 m)   Wt 114.3 kg   LMP 08/02/2019   SpO2 98%   BMI 44.64 kg/m   Physical Exam Vitals and nursing note reviewed.  Constitutional:      General: She is not in acute distress.    Appearance: She is well-developed.  HENT:     Head: Atraumatic.  Eyes:     General: No visual field deficit.  Conjunctiva/sclera: Conjunctivae normal.  Cardiovascular:     Rate and Rhythm: Normal rate and regular rhythm.  Pulmonary:     Effort: Pulmonary effort is normal.     Breath sounds: Normal breath sounds.  Musculoskeletal:     Cervical back: Neck supple. No rigidity.  Skin:    Findings: No rash.  Neurological:     Mental Status: She is alert and oriented to person, place, and time.     GCS: GCS eye subscore is 4. GCS verbal subscore is 5. GCS motor subscore is 6.     Cranial Nerves: No cranial nerve deficit, dysarthria or facial asymmetry.     Sensory: No sensory deficit.     Coordination: Romberg sign negative.     Gait: Gait normal.  Psychiatric:        Mood and Affect: Mood normal.     ED Results / Procedures / Treatments   Labs (all labs ordered are listed, but only abnormal results are displayed) Labs Reviewed - No data to  display  EKG None  Radiology No results found.  Procedures Procedures (including critical care time)  Medications Ordered in ED Medications - No data to display  ED Course  I have reviewed the triage vital signs and the nursing notes.  Pertinent labs & imaging results that were available during my care of the patient were reviewed by me and considered in my medical decision making (see chart for details).    MDM Rules/Calculators/A&P                      BP (!) 127/91 (BP Location: Right Arm)   Pulse (!) 102   Temp 98.5 F (36.9 C) (Oral)   Resp 18   Ht 5\' 3"  (1.6 m)   Wt 114.3 kg   LMP 08/02/2019   SpO2 98%   BMI 44.64 kg/m   Final Clinical Impression(s) / ED Diagnoses Final diagnoses:  Bad headache    Rx / DC Orders ED Discharge Orders         Ordered    butalbital-acetaminophen-caffeine (FIORICET) 50-325-40 MG tablet  Every 6 hours PRN     08/16/19 2113         9:10 PM Headache similar to previous, no fever, neck stiffness, neuro findings or new symptoms to suggest more serious etiology.  I don't think SAH, ICH, meningitis, encephalitis, mass at this time.  No recent trauma.  I don't feel imaging necessary at this time.  Plan to control symptoms.    2114, PA-C 08/16/19 2114    2115, MD 08/17/19 [phone]

## 2019-08-16 NOTE — ED Triage Notes (Signed)
Pt c/o headache x 6 days that is worse when lays down. Taking ibuprofen and tylenol but headache comes back. Got Covid tested when headaches started but was negative.

## 2019-10-26 ENCOUNTER — Other Ambulatory Visit: Payer: Self-pay

## 2019-10-26 ENCOUNTER — Encounter (HOSPITAL_COMMUNITY): Payer: Self-pay | Admitting: *Deleted

## 2019-10-26 ENCOUNTER — Emergency Department (HOSPITAL_COMMUNITY): Payer: Self-pay

## 2019-10-26 ENCOUNTER — Emergency Department (HOSPITAL_COMMUNITY)
Admission: EM | Admit: 2019-10-26 | Discharge: 2019-10-26 | Disposition: A | Discharge status: Home / Self Care | Payer: Self-pay | Attending: Emergency Medicine | Admitting: Emergency Medicine

## 2019-10-26 DIAGNOSIS — Z79899 Other long term (current) drug therapy: Secondary | ICD-10-CM | POA: Insufficient documentation

## 2019-10-26 DIAGNOSIS — R0789 Other chest pain: Secondary | ICD-10-CM | POA: Insufficient documentation

## 2019-10-26 DIAGNOSIS — Z87891 Personal history of nicotine dependence: Secondary | ICD-10-CM | POA: Insufficient documentation

## 2019-10-26 LAB — POC URINE PREG, ED: Preg Test, Ur: NEGATIVE

## 2019-10-26 MED ORDER — IBUPROFEN 400 MG PO TABS: 400.0000 mg | ORAL_TABLET | Freq: Four times a day (QID) | ORAL | 0 refills | Status: AC | PRN

## 2019-10-26 MED ORDER — IBUPROFEN 800 MG PO TABS
800.0000 mg | ORAL_TABLET | Freq: Once | ORAL | Status: AC
  Administered 2019-10-26: 800 mg via ORAL
  Filled 2019-10-26: qty 1

## 2019-10-26 NOTE — ED Provider Notes (Signed)
[facility] Provider Note   CSN: [id number] Arrival date & time: 10/26/19  1426     History Chief Complaint  Patient presents with  . Muscle Pain    Cindy Middleton is a 24 y.o. female.  HPI      24 year old female presents with chest wall pain.  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  She notes when she carries the trays she has pain across the anterior chest wall.  She notes pain is worse when she carries trays or lifts heavy things.  She denies any associated shortness of breath, nausea, vomiting.  She denies any exertional chest pain.  She denies any fevers, chills.  She denies any recent travel/trauma/immobilization, peripheral edema, previously diagnosed PE/DVT, OCP use.    Past Medical History:  Diagnosis Date  . Gestational hypertension   . Late prenatal care   . Medical history non-contributory     Patient Active Problem List   Diagnosis Date Noted  . Postpartum hemorrhage 01/08/2017  . Encounter for induction of labor 01/02/2017  . Gestational hypertension 01/02/2017    Past Surgical History:  Procedure Laterality Date  . NO PAST SURGERIES       OB History    Gravida  1   Para  1   Term  1   Preterm      AB      Living  1     SAB      TAB      Ectopic      Multiple      Live Births  1           Family History  Problem Relation Age of Onset  . Diabetes Mother   . Diabetes Maternal Grandmother   . Hypertension Maternal Grandmother     Social History   Tobacco Use  . Smoking status: Former Games developer  . Smokeless tobacco: Never Used  Substance Use Topics  . Alcohol use: No  . Drug use: No    Home Medications Prior to Admission medications   Medication Sig Start Date End Date Taking? Authorizing Provider  butalbital-acetaminophen-caffeine (FIORICET) 50-325-40 MG tablet Take 1 tablet by mouth every 6 (six) hours as needed for headache. 08/16/19 08/15/20  Fayrene Helper, PA-C    medroxyPROGESTERone (DEPO-PROVERA) 150 MG/ML injection Inject 1 mL (150 mg total) into the muscle every 3 (three) months. 04/13/17   Cresenzo-Dishmon, Scarlette Calico, CNM  predniSONE (DELTASONE) 10 MG tablet 6 day taper 05/08/18   Teressa Lower, NP    Allergies    Patient has no known allergies.  Review of Systems   Review of Systems  Constitutional: Negative for chills and fever.  Respiratory: Negative for shortness of breath.   Cardiovascular: Positive for chest pain. Negative for leg swelling.  Gastrointestinal: Negative for abdominal pain, nausea and vomiting.  Musculoskeletal: Negative for gait problem and joint swelling.    Physical Exam Updated Vital Signs BP (!) 150/88 (BP Location: Left Arm)   Pulse 89   Temp 97.9 F (36.6 C) (Oral)   Resp 17   Ht 5\' 3"  (1.6 m)   Wt 114.8 kg   SpO2 98%   BMI 44.82 kg/m   Physical Exam Vitals and nursing note reviewed.  Constitutional:      Appearance: She is well-developed.  HENT:     Head: Normocephalic and atraumatic.  Eyes:     Conjunctiva/sclera: Conjunctivae normal.  Cardiovascular:     Rate and Rhythm: Normal rate  and regular rhythm.     Heart sounds: Normal heart sounds. No murmur.  Pulmonary:     Effort: Pulmonary effort is normal. No respiratory distress.     Breath sounds: Normal breath sounds. No wheezing or rales.  Chest:     Chest wall: Tenderness (mildly tender of the anterior chest wall, L>R between the clavicle and 4th rib) present.  Abdominal:     General: Bowel sounds are normal. There is no distension.     Palpations: Abdomen is soft.     Tenderness: There is no abdominal tenderness.  Musculoskeletal:        General: No tenderness or deformity. Normal range of motion.     Cervical back: Neck supple.  Skin:    General: Skin is warm and dry.     Findings: No erythema or rash.  Neurological:     Mental Status: She is alert and oriented to person, place, and time.  Psychiatric:        Behavior: Behavior  normal.     ED Results / Procedures / Treatments   Labs (all labs ordered are listed, but only abnormal results are displayed) Labs Reviewed  POC URINE PREG, ED    EKG EKG Interpretation  Date/Time:  Saturday October 26 2019 15:30:28 EST Ventricular Rate:  101 PR Interval:    QRS Duration: 82 QT Interval:  356 QTC Calculation: 462 R Axis:   70 Text Interpretation: Sinus tachycardia No significant change was found Confirmed by Ezequiel Essex [phone]) on 10/26/2019 3:39:35 PM   Radiology DG Chest 2 View  Result Date: 10/26/2019 CLINICAL DATA:  Chest wall pain EXAM: CHEST - 2 VIEW COMPARISON:  01/04/2017 FINDINGS: The heart size and mediastinal contours are within normal limits. Both lungs are clear. The visualized skeletal structures are unremarkable. IMPRESSION: No acute abnormality of the lungs. Electronically Signed   By: Eddie Candle M.D.   On: 10/26/2019 15:55    Procedures Procedures (including critical care time)  Medications Ordered in ED Medications  ibuprofen (ADVIL) tablet 800 mg (has no administration in time range)    ED Course  I have reviewed the triage vital signs and the nursing notes.  Pertinent labs & imaging results that were available during my care of the patient were reviewed by me and considered in my medical decision making (see chart for details).    MDM Rules/Calculators/A&P                       Patient presents with chest wall pain.  Her pain is easily reproducible on physical exam.  Her lungs are clear to auscultation throughout.  Patient has no increased work of breathing or accessory muscle use.  She is Wells low risk, history and physical not consistent with PE.  Her urine pregnancy is negative.  Chest x-ray shows no evidence of pneumonia, pneumothorax, pleural effusion.  EKG with no acute ST changes.  Pain most consistent with musculoskeletal.  Will place on NSAID.  Given return precautions.  Patient ready and stable for discharge.    At this time there does not appear to be any evidence of an acute emergency medical condition and the patient appears stable for discharge with appropriate outpatient follow up.Diagnosis was discussed with patient who verbalizes understanding and is agreeable to discharge.    Final Clinical Impression(s) / ED Diagnoses Final diagnoses:  None    Rx / DC Orders ED Discharge Orders    None  Rueben Bash 10/26/19 2112    Glynn Octave, MD 10/26/19 2128

## 2019-10-26 NOTE — Discharge Instructions (Addendum)
Take Motrin as needed for pain.  Follow-up with your primary care provider in 2 days for continued evaluation.  Return to the emergency room immediately for new or worsening symptoms or concerns, such as new or worsening pain, shortness of breath, fevers or any concerns at all.

## 2019-10-26 NOTE — ED Triage Notes (Signed)
Pt states she has been having muscle pain in upper chest area. 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 Googled symptoms and decided to come to the hospital.

## 2021-04-24 IMAGING — CR DG CHEST 2V
2 series · 2 of 2 positions shown · non-contrast
Comparison: 01/04/2017

CLINICAL DATA: Chest wall pain

EXAM:
CHEST - 2 VIEW

[w chest pa]
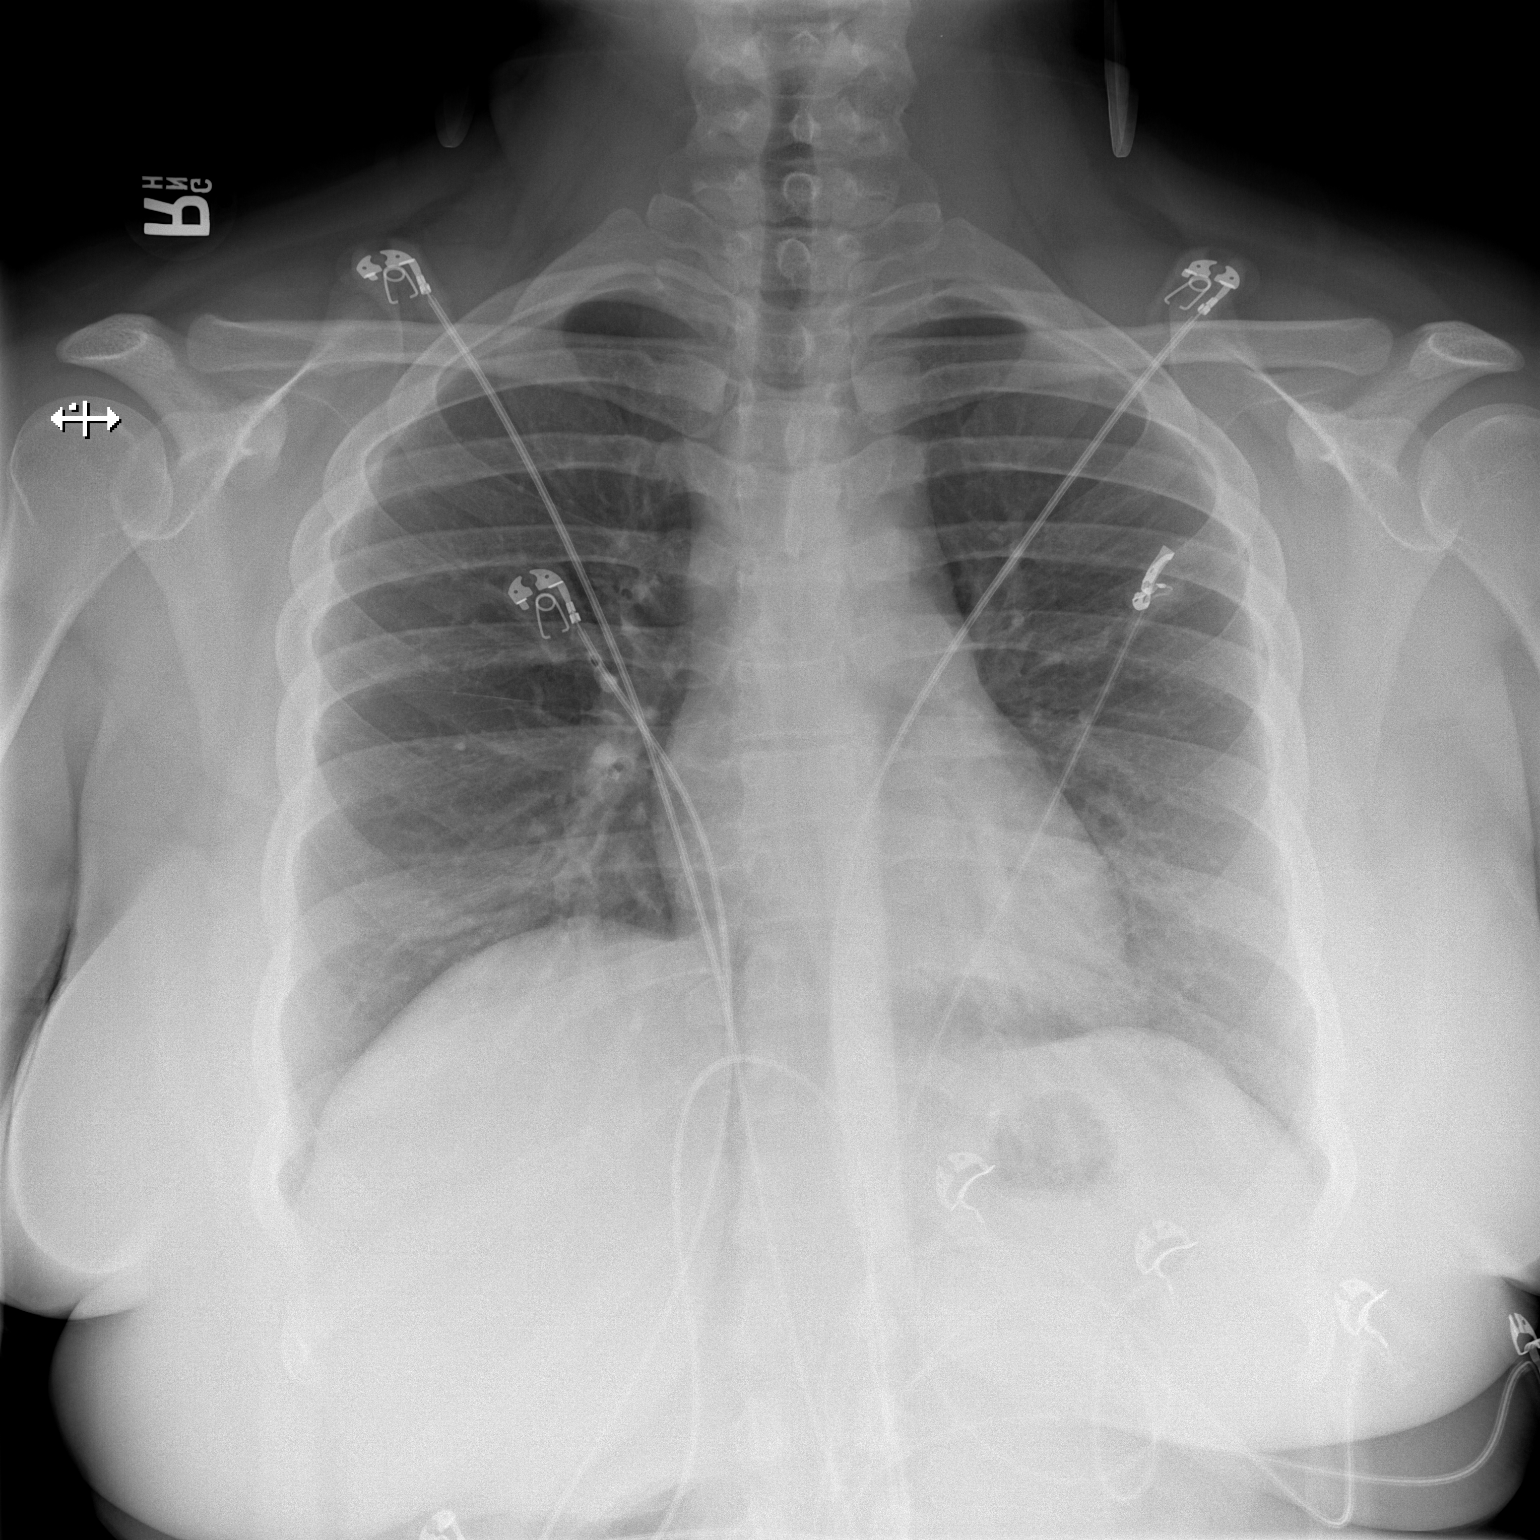

[w chest lat]
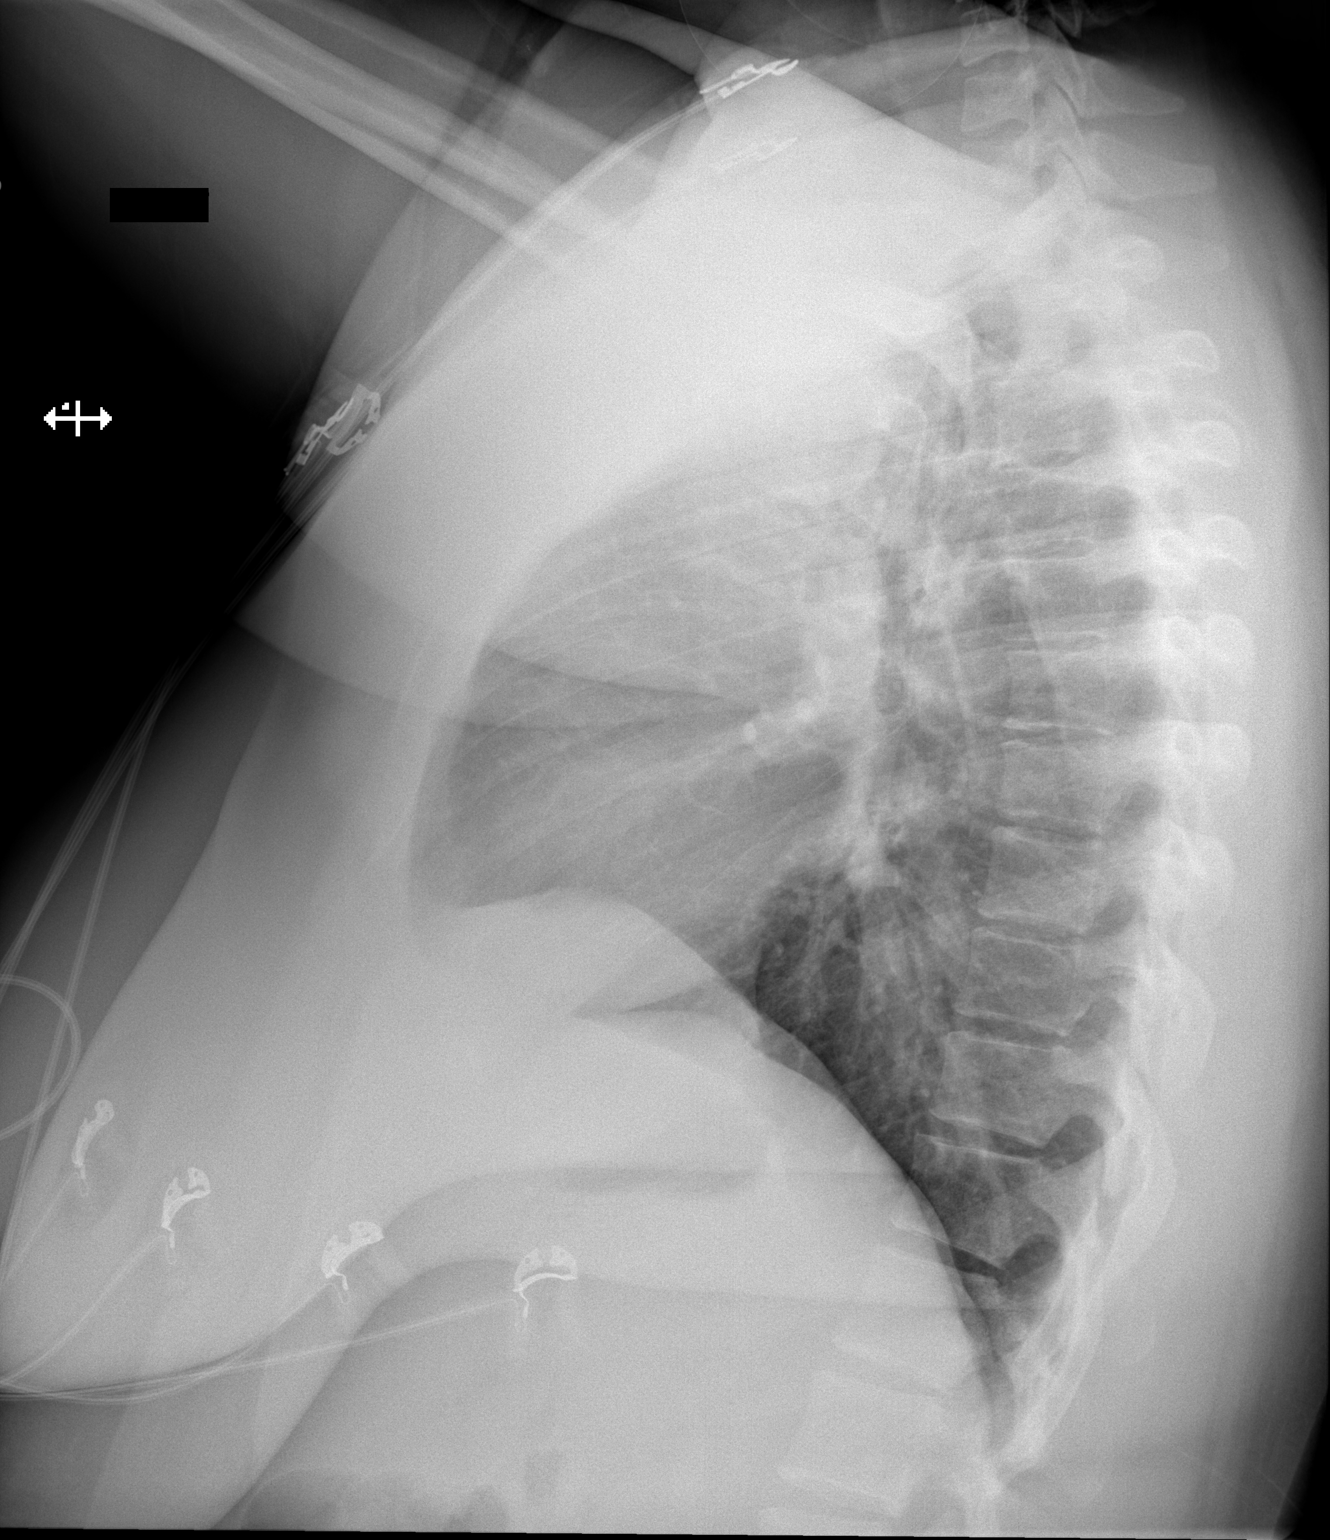

[2 of 2 positions shown; findings below may reference images not displayed]

FINDINGS: The heart size and mediastinal contours are within normal limits.
Both lungs are clear. The visualized skeletal structures are
unremarkable.
IMPRESSION: No acute abnormality of the lungs.

## 2021-05-20 ENCOUNTER — Emergency Department (HOSPITAL_COMMUNITY)
Admission: EM | Admit: 2021-05-20 | Discharge: 2021-05-20 | Disposition: A | Discharge status: Home / Self Care | Payer: Self-pay | Attending: Emergency Medicine | Admitting: Emergency Medicine

## 2021-05-20 ENCOUNTER — Encounter (HOSPITAL_COMMUNITY): Payer: Self-pay

## 2021-05-20 ENCOUNTER — Other Ambulatory Visit: Payer: Self-pay

## 2021-05-20 DIAGNOSIS — Z87891 Personal history of nicotine dependence: Secondary | ICD-10-CM | POA: Insufficient documentation

## 2021-05-20 DIAGNOSIS — R252 Cramp and spasm: Secondary | ICD-10-CM

## 2021-05-20 DIAGNOSIS — M79641 Pain in right hand: Secondary | ICD-10-CM | POA: Insufficient documentation

## 2021-05-20 LAB — CBG MONITORING, ED: Glucose-Capillary: 133 mg/dL — ABNORMAL HIGH (ref 70–99)

## 2021-05-20 NOTE — ED Provider Notes (Addendum)
[facility]-EMERGENCY DEPT Provider Note   CSN: [id number] Arrival date & time: 05/20/21  1559     History Chief Complaint  Patient presents with   Hand Pain    Cindy Middleton is a 25 y.o. female with medical history as noted below.  Presents to the emergency department with a chief complaint of episode of difficulty moving fingers on right hand.  Patient reports that 4 days earlier after leaving her house her right index finger became stuck in a flexed position for "a few seconds."  This spontaneously resolved.  Today patient reports that she was using a spray bottle continuously for 2 to 3 minutes after that time patient reports all digits of right hand were stopped in a flexed position for approximately 10 minutes.  This resolved spontaneously.  Patient denied any associated pain, color change, rash.  Patient is right-hand dominant.   Hand Pain      Past Medical History:  Diagnosis Date   Gestational hypertension    Late prenatal care    Medical history non-contributory     Patient Active Problem List   Diagnosis Date Noted   Postpartum hemorrhage 01/08/2017   Encounter for induction of labor 01/02/2017   Gestational hypertension 01/02/2017    Past Surgical History:  Procedure Laterality Date   NO PAST SURGERIES       OB History     Gravida  1   Para  1   Term  1   Preterm      AB      Living  1      SAB      IAB      Ectopic      Multiple      Live Births  1           Family History  Problem Relation Age of Onset   Diabetes Mother    Diabetes Maternal Grandmother    Hypertension Maternal Grandmother     Social History   Tobacco Use   Smoking status: Former   Smokeless tobacco: Never  Building services engineer Use: Never used  Substance Use Topics   Alcohol use: No   Drug use: No    Home Medications Prior to Admission medications   Medication Sig Start Date End Date Taking? Authorizing Provider  ibuprofen  (ADVIL) 400 MG tablet Take 1 tablet (400 mg total) by mouth every 6 (six) hours as needed. 10/26/19   Kendrick, Caitlyn S, PA-C  medroxyPROGESTERone (DEPO-PROVERA) 150 MG/ML injection Inject 1 mL (150 mg total) into the muscle every 3 (three) months. 04/13/17   Cresenzo-Dishmon, Scarlette Calico, CNM  predniSONE (DELTASONE) 10 MG tablet 6 day taper 05/08/18   Teressa Lower, NP    Allergies    Patient has no known allergies.  Review of Systems   Review of Systems  Constitutional:  Negative for chills and fever.  Musculoskeletal:  Negative for arthralgias, joint swelling and myalgias.  Skin:  Negative for color change, pallor, rash and wound.  Neurological:  Negative for weakness and numbness.   Physical Exam Updated Vital Signs BP (!) 148/105 (BP Location: Left Arm)   Pulse 97   Temp 97.8 F (36.6 C) (Oral)   Resp 18   Ht 5\' 3"  (1.6 m)   Wt 114.3 kg   SpO2 100%   BMI 44.64 kg/m   Physical Exam Vitals and nursing note reviewed.  Constitutional:      General: She is not in acute  distress.    Appearance: She is not ill-appearing, toxic-appearing or diaphoretic.  HENT:     Head: Normocephalic.  Eyes:     General: No scleral icterus.       Right eye: No discharge.        Left eye: No discharge.  Cardiovascular:     Rate and Rhythm: Normal rate.     Pulses:          Radial pulses are 2+ on the right side and 2+ on the left side.  Pulmonary:     Effort: Pulmonary effort is normal.  Musculoskeletal:     Right elbow: Normal.     Right forearm: Normal.     Right wrist: Normal.     Left wrist: Normal.     Right hand: No swelling, deformity, lacerations, tenderness or bony tenderness. Normal range of motion. Normal strength. Normal sensation. Normal capillary refill. Normal pulse.     Left hand: No swelling, deformity, lacerations, tenderness or bony tenderness. Normal range of motion. Normal strength. Normal sensation. Normal capillary refill. Normal pulse.  Skin:    General: Skin is  warm and dry.  Neurological:     General: No focal deficit present.     Mental Status: She is alert.  Psychiatric:        Behavior: Behavior is cooperative.    ED Results / Procedures / Treatments   Labs (all labs ordered are listed, but only abnormal results are displayed) Labs Reviewed - No data to display  EKG None  Radiology No results found.  Procedures Procedures   Medications Ordered in ED Medications - No data to display  ED Course  I have reviewed the triage vital signs and the nursing notes.  Pertinent labs & imaging results that were available during my care of the patient were reviewed by me and considered in my medical decision making (see chart for details).    MDM Rules/Calculators/A&P                           Alert 25 year old female no acute stress, nontoxic-appearing.  Presents to ED with chief complaint of difficulty moving fingers on right hand.  Patient reports 2 episodes of this.  Pulse, motor, and sensation are intact to right hand.  No rash, wounds, or color change noted.  Low suspicion for acute fracture or dislocation or infection.  Patient had no numbness, weakness, facial asymmetry, slurred speech, or aphasia.  Low suspicion for CVA.  Patient is requesting to have her blood sugar checked.  Patient denies any history of diabetes but states that she has a family history.  POC CBG 131.  We will have patient follow-up with hand specialist if she continues to have this issue moving forward.  Patient was noted to be hypertensive in emergency department.  Patient given directions to follow-up with primary care provider for further evaluation of blood pressure.  Discussed results, findings, treatment and follow up. Patient advised of return precautions. Patient verbalized understanding and agreed with plan.   Final Clinical Impression(s) / ED Diagnoses Final diagnoses:  Hand cramp    Rx / DC Orders ED Discharge Orders     None         Haskel Schroeder, PA-C 05/20/21 1706    Haskel Schroeder, PA-C 05/20/21 1707    Koleen Distance, MD 05/20/21 [phone]

## 2021-05-20 NOTE — Discharge Instructions (Addendum)
You came to the emergency department today to have your right hand assessed.  Your physical exam was reassuring.  Based on your exam I have a low suspicion that you have any broken bones or infection causing her symptoms.  It sounds like your symptoms are due to muscle cramps.  While in the emergency department your blood pressure was found to be elevated.  Please use the information on this paperwork to follow-up with a primary care provider.  You may also call [facility] to set up a primary care provider appointment as well.  Contact a health care provider if: Your cramps or spasms get more severe or happen more often. Your cramps or spasms do not improve over time.

## 2021-05-20 NOTE — ED Triage Notes (Signed)
Pt reports two episode within the last week where her right hand became stuck after cleaning and using a squirt bottle.

## 2022-01-25 ENCOUNTER — Other Ambulatory Visit: Payer: Self-pay

## 2022-01-25 ENCOUNTER — Encounter (HOSPITAL_COMMUNITY): Payer: Self-pay

## 2022-01-25 ENCOUNTER — Emergency Department (HOSPITAL_COMMUNITY)
Admission: EM | Admit: 2022-01-25 | Discharge: 2022-01-25 | Disposition: A | Discharge status: Home / Self Care | Payer: Self-pay | Attending: Emergency Medicine | Admitting: Emergency Medicine

## 2022-01-25 DIAGNOSIS — H6091 Unspecified otitis externa, right ear: Secondary | ICD-10-CM | POA: Insufficient documentation

## 2022-01-25 DIAGNOSIS — H60501 Unspecified acute noninfective otitis externa, right ear: Secondary | ICD-10-CM

## 2022-01-25 MED ORDER — AMOXICILLIN-POT CLAVULANATE 875-125 MG PO TABS: 1.0000 | ORAL_TABLET | Freq: Two times a day (BID) | ORAL | 0 refills | Status: AC

## 2022-01-25 MED ORDER — NEOMYCIN-POLYMYXIN-HC 3.5-10000-1 OT SUSP: 4.0000 [drp] | Freq: Three times a day (TID) | OTIC | 0 refills | Status: AC

## 2022-01-25 NOTE — ED Provider Notes (Signed)
[facility]-EMERGENCY DEPT Provider Note   CSN: [id number] Arrival date & time: 01/25/22  0809     History  Chief Complaint  Patient presents with   Otalgia    Cindy Middleton is a 26 y.o. female.  The history is provided by the patient.  Otalgia Location:  Right Behind ear:  No abnormality Quality:  Aching Severity:  Mild Onset quality:  Gradual Duration:  6 days Timing:  Constant Progression:  Unchanged Chronicity:  New Context: not water in ear   Relieved by:  Nothing Worsened by:  Nothing Associated symptoms: no abdominal pain, no congestion, no cough, no diarrhea, no ear discharge, no fever, no headaches, no hearing loss, no neck pain, no rash, no rhinorrhea, no sore throat, no tinnitus and no vomiting       Home Medications Prior to Admission medications   Medication Sig Start Date End Date Taking? Authorizing Provider  amoxicillin-clavulanate (AUGMENTIN) 875-125 MG tablet Take 1 tablet by mouth every 12 (twelve) hours for 10 days. 01/25/22 02/04/22 Yes Sullivan Jacuinde, DO  neomycin-polymyxin-hydrocortisone (CORTISPORIN) 3.5-10000-1 OTIC suspension Place 4 drops into the right ear 3 (three) times daily for 10 days. 01/25/22 02/04/22 Yes Jozey Janco, DO  ibuprofen (ADVIL) 400 MG tablet Take 1 tablet (400 mg total) by mouth every 6 (six) hours as needed. 10/26/19   Kendrick, Caitlyn S, PA-C  medroxyPROGESTERone (DEPO-PROVERA) 150 MG/ML injection Inject 1 mL (150 mg total) into the muscle every 3 (three) months. 04/13/17   Cresenzo-Dishmon, Scarlette Calico, CNM  predniSONE (DELTASONE) 10 MG tablet 6 day taper 05/08/18   Teressa Lower, NP      Allergies    Patient has no known allergies.    Review of Systems   Review of Systems  Constitutional:  Negative for fever.  HENT:  Positive for ear pain. Negative for congestion, ear discharge, hearing loss, rhinorrhea, sore throat and tinnitus.   Respiratory:  Negative for cough.   Gastrointestinal:  Negative for  abdominal pain, diarrhea and vomiting.  Musculoskeletal:  Negative for neck pain.  Skin:  Negative for rash.  Neurological:  Negative for headaches.   Physical Exam Updated Vital Signs  ED Triage Vitals [01/25/22 0815]  Enc Vitals Group     BP (!) 120/101     Pulse Rate (!) 102     Resp 18     Temp 98.2 F (36.8 C)     Temp Source Oral     SpO2 99 %     Weight 250 lb (113.4 kg)     Height 5\' 3"  (1.6 m)     Head Circumference      Peak Flow      Pain Score 8     Pain Loc      Pain Edu?      Excl. in GC?     Physical Exam HENT:     Head: Normocephalic and atraumatic.     Left Ear: Tympanic membrane, ear canal and external ear normal.     Ears:     Comments: Right ear canal with inflammation, small amount of purulence, unable to see tympanic membrane, no swelling in the mastoid area bilaterally, left ear canal and TM without any signs of infection    Nose: Nose normal.     Mouth/Throat:     Mouth: Mucous membranes are moist.  Eyes:     Pupils: Pupils are equal, round, and reactive to light.  Cardiovascular:     Pulses: Normal pulses.  Skin:  Capillary Refill: Capillary refill takes less than 2 seconds.  Neurological:     Mental Status: She is alert.    ED Results / Procedures / Treatments   Labs (all labs ordered are listed, but only abnormal results are displayed) Labs Reviewed - No data to display  EKG None  Radiology No results found.  Procedures Procedures    Medications Ordered in ED Medications - No data to display  ED Course/ Medical Decision Making/ A&P                           Medical Decision Making Risk Prescription drug management.   Cindy Middleton is here with right ear pain for the last 6 days.  Appears to have an otitis externa may be otitis media.  No major medical problems.  Unremarkable vitals.  No obvious exposures for infectious process.  There is no swelling to the mastoid area.  Overall appears to be a localized infection.   Unable to really see the tympanic membrane.  There is some mild purulence in the canal.  However the canal is patent.  Will prescribe Cortisporin drops and Augmentin.  We will have her follow-up with ENT as she does not have a primary care doctor.  Discharged in good condition.  This chart was dictated using voice recognition software.  Despite best efforts to proofread,  errors can occur which can change the documentation meaning.         Final Clinical Impression(s) / ED Diagnoses Final diagnoses:  Acute otitis externa of right ear, unspecified type    Rx / DC Orders ED Discharge Orders          Ordered    neomycin-polymyxin-hydrocortisone (CORTISPORIN) 3.5-10000-1 OTIC suspension  3 times daily        01/25/22 0832    amoxicillin-clavulanate (AUGMENTIN) 875-125 MG tablet  Every 12 hours        01/25/22 0832              Virgina Norfolk, DO 01/25/22 [phone]

## 2022-01-25 NOTE — ED Triage Notes (Signed)
Patient c/o right ear pain x 6 days. Patient denies a fever.

## 2023-04-08 ENCOUNTER — Encounter (HOSPITAL_COMMUNITY): Payer: Self-pay

## 2023-04-08 ENCOUNTER — Other Ambulatory Visit: Payer: Self-pay

## 2023-04-08 ENCOUNTER — Emergency Department (HOSPITAL_COMMUNITY)
Admission: EM | Admit: 2023-04-08 | Discharge: 2023-04-08 | Disposition: A | Discharge status: Home / Self Care | Payer: Medicaid Other | Source: Home / Self Care | Attending: Student | Admitting: Student

## 2023-04-08 DIAGNOSIS — O99611 Diseases of the digestive system complicating pregnancy, first trimester: Secondary | ICD-10-CM | POA: Diagnosis not present

## 2023-04-08 DIAGNOSIS — O26891 Other specified pregnancy related conditions, first trimester: Secondary | ICD-10-CM | POA: Diagnosis present

## 2023-04-08 DIAGNOSIS — Z3A11 11 weeks gestation of pregnancy: Secondary | ICD-10-CM | POA: Diagnosis not present

## 2023-04-08 DIAGNOSIS — K5900 Constipation, unspecified: Secondary | ICD-10-CM | POA: Insufficient documentation

## 2023-04-08 LAB — URINALYSIS, ROUTINE W REFLEX MICROSCOPIC
Bilirubin Urine: NEGATIVE
Glucose, UA: NEGATIVE mg/dL
Ketones, ur: 80 mg/dL — AB
Leukocytes,Ua: NEGATIVE
Nitrite: NEGATIVE
Protein, ur: 100 mg/dL — AB
Specific Gravity, Urine: 1.024 (ref 1.005–1.030)
pH: 5 (ref 5.0–8.0)

## 2023-04-08 LAB — HCG, QUANTITATIVE, PREGNANCY: hCG, Beta Chain, Quant, S: 140655 m[IU]/mL — ABNORMAL HIGH (ref <5)

## 2023-04-08 MED ORDER — LACTULOSE 10 GM/15ML PO SOLN
20.0000 g | Freq: Once | ORAL | Status: AC
  Administered 2023-04-08: 20 g via ORAL
  Filled 2023-04-08: qty 30

## 2023-04-08 MED ORDER — GLYCERIN (LAXATIVE) 2 G RE SUPP
1.0000 | Freq: Once | RECTAL | Status: AC
  Administered 2023-04-08: 1 via RECTAL
  Filled 2023-04-08 (×2): qty 1

## 2023-04-08 MED ORDER — POLYETHYLENE GLYCOL 3350 17 G PO PACK: 17.0000 g | PACK | Freq: Every day | ORAL | 0 refills | Status: AC

## 2023-04-08 MED ORDER — MAGNESIUM CITRATE PO SOLN: 1.0000 | Freq: Once | ORAL | Status: DC

## 2023-04-08 NOTE — ED Provider Notes (Signed)
[place] EM[facility]ENC[facility] DEPARTMENT AT [facility] Provider Note   CSN: [id number] Arrival date & time: 04/08/23  4098     History  Chief Complaint  Patient presents with   Constipation    Cindy Middleton is a 27 y.o. female with a history of gestational hypertension who presents to the ED today for constipation.  She is currently [redacted] weeks pregnant.  Patient reports constipation for 5 days with associated right lower quadrant pain and rectal pain with straining.  She has increased her water intake but it has not helped her to have a bowel movement.  Denies vaginal bleeding.  No other complaints or concerns at this time.    Home Medications Prior to Admission medications   Medication Sig Start Date End Date Taking? Authorizing Provider  cholestyramine (QUESTRAN) 4 GM/DOSE powder Take 4 g by mouth 2 (two) times daily.   Yes [provider]  ibuprofen (ADVIL) 400 MG tablet Take 1 tablet (400 mg total) by mouth every 6 (six) hours as needed. 10/26/19  Yes Kendrick, Caitlyn S, PA-C  Prenatal Vit-Fe Fumarate-FA (MULTIVITAMIN-PRENATAL) 27-0.8 MG TABS tablet Take 1 tablet by mouth daily. 03/15/23  Yes [provider]  medroxyPROGESTERone (DEPO-PROVERA) 150 MG/ML injection Inject 1 mL (150 mg total) into the muscle every 3 (three) months. Patient not taking: Reported on 04/08/2023 04/13/17   Cresenzo-Dishmon, Scarlette Calico, CNM  predniSONE (DELTASONE) 10 MG tablet 6 day taper Patient not taking: Reported on 04/08/2023 05/08/18   Teressa Lower, NP      Allergies    Patient has no known allergies.    Review of Systems   Review of Systems  Gastrointestinal:  Positive for constipation.  All other systems reviewed and are negative.   Physical Exam Updated Vital Signs BP (!) 168/128 (BP Location: Left Arm)   Pulse (!) 115   Temp 97.8 F (36.6 C) (Oral)   Resp 16   Ht 5\' 3"  (1.6 m)   Wt 122.5 kg   SpO2 100%   BMI 47.83 kg/m  Physical Exam Vitals and nursing note  reviewed.  Constitutional:      General: She is not in acute distress.    Appearance: Normal appearance.  HENT:     Head: Normocephalic and atraumatic.     Mouth/Throat:     Mouth: Mucous membranes are moist.  Eyes:     Conjunctiva/sclera: Conjunctivae normal.     Pupils: Pupils are equal, round, and reactive to light.  Cardiovascular:     Rate and Rhythm: Normal rate and regular rhythm.     Pulses: Normal pulses.  Pulmonary:     Effort: Pulmonary effort is normal.     Breath sounds: Normal breath sounds.  Abdominal:     Palpations: Abdomen is soft.     Tenderness: There is abdominal tenderness.     Comments: RLQ tenderness  Skin:    General: Skin is warm and dry.     Findings: No rash.  Neurological:     General: No focal deficit present.     Mental Status: She is alert.  Psychiatric:        Mood and Affect: Mood normal.        Behavior: Behavior normal.     ED Results / Procedures / Treatments   Labs (all labs ordered are listed, but only abnormal results are displayed) Labs Reviewed  URINALYSIS, ROUTINE W REFLEX MICROSCOPIC - Abnormal; Notable for the following components:      Result Value  Color, Urine AMBER (*)    APPearance HAZY (*)    Hgb urine dipstick SMALL (*)    Ketones, ur 80 (*)    Protein, ur 100 (*)    Bacteria, UA RARE (*)    All other components within normal limits  URINE CULTURE    EKG None  Radiology No results found.  Procedures Procedures: not indicated.   Medications Ordered in ED Medications - No data to display  ED Course/ Medical Decision Making/ A&P                                 Medical Decision Making Amount and/or Complexity of Data Reviewed Labs: ordered.  Risk OTC drugs. Prescription drug management.   This patient presents to the ED for concern of constipation, this involves an extensive number of treatment options, and is a complaint that carries with it a high risk of complications and morbidity.    Differential diagnosis includes: IBS, constipation secondary to pregnancy, IBD, etc.   Co morbidities that complicate the patient evaluation  Gestational hypertension   Additional history  Additional history obtained from patient's primary care records.   Lab Tests  I ordered and personally interpreted labs.  The pertinent results include:   Rare bacteria, 6-10/HPF squamous epithelial cells, 100 mg/dL protein, 80 mg/dL ketones was within normal limits.  Urine sent for culture.   Problem List / ED Course / Critical interventions / Medication management  Constipation with right lower quadrant and pain I ordered medications including: Glycerin suppository, Lactulose, and a soap suds enema for constipation  Reevaluation of the patient after these medicines showed that the patient resolved -patient denied pain in rectum or right lower quadrant after having a bowel movement.  No imaging required at this time. I have reviewed the patients home medicines and have made adjustments as needed   Social Determinants of Health  Access to healthcare   Test / Admission - Considered  Discussed results with patient. Since pain resolved with BM, further imaging was not needed. Patient is stable and vitals are within normal limits.  She is safe for discharge home. Scription for MiraLAX sent to the pharmacy. Return precautions provided.       Final Clinical Impression(s) / ED Diagnoses Final diagnoses:  None    Rx / DC Orders ED Discharge Orders     None         Maxwell Marion, PA-C 04/08/23 1659    Kommor, Wyn Forster, MD 04/09/23 [phone]

## 2023-04-08 NOTE — Discharge Instructions (Addendum)
I have sent a prescription of Miralax to your pharmacy. Take it as needed for constipation. We are sending your urine for culture to make sure you do not have a bladder infection. You will receive a call from the ED if you need antibiotics.  Follow up with your PCP in 3-5 days for reevaluation of your symptoms.  Solicite ayuda de inmediato si: Lance Muss, y los sntomas empeoran de repente. Tiene prdida de materia fecal u observa [employer]. Siente el vientre ms duro o ms grande de lo normal (hinchado). Siente un dolor muy intenso en el vientre. Se siente mareado o se desmaya.

## 2023-04-08 NOTE — ED Triage Notes (Signed)
Pt arrived via POV. C/o constipation for 5x days, normally has BM every day. C/o some rectal pain. Pt is [redacted] weeks pregnant.   AOx4

## 2023-04-09 LAB — URINE CULTURE: Culture: NO GROWTH

## 2023-12-22 ENCOUNTER — Other Ambulatory Visit: Payer: Self-pay

## 2023-12-22 ENCOUNTER — Emergency Department (HOSPITAL_COMMUNITY)
Admission: EM | Admit: 2023-12-22 | Discharge: 2023-12-23 | Disposition: A | Discharge status: Home / Self Care | Attending: Emergency Medicine | Admitting: Emergency Medicine

## 2023-12-22 DIAGNOSIS — R102 Pelvic and perineal pain: Secondary | ICD-10-CM

## 2023-12-22 DIAGNOSIS — R1030 Lower abdominal pain, unspecified: Secondary | ICD-10-CM | POA: Insufficient documentation

## 2023-12-22 LAB — COMPREHENSIVE METABOLIC PANEL WITH GFR
ALT: 41 U/L (ref 0–44)
AST: 33 U/L (ref 15–41)
Albumin: 3.9 g/dL (ref 3.5–5.0)
Alkaline Phosphatase: 56 U/L (ref 38–126)
Anion gap: 7 (ref 5–15)
BUN: 10 mg/dL (ref 6–20)
CO2: 24 mmol/L (ref 22–32)
Calcium: 8.9 mg/dL (ref 8.9–10.3)
Chloride: 105 mmol/L (ref 98–111)
Creatinine, Ser: 0.62 mg/dL (ref 0.44–1.00)
GFR, Estimated: >60 mL/min (ref >60)
Glucose, Bld: 92 mg/dL (ref 70–99)
Potassium: 3.7 mmol/L (ref 3.5–5.1)
Sodium: 136 mmol/L (ref 135–145)
Total Bilirubin: 0.9 mg/dL (ref 0.0–1.2)
Total Protein: 7 g/dL (ref 6.5–8.1)

## 2023-12-22 LAB — CBC
HCT: 39.1 % (ref 36.0–46.0)
Hemoglobin: 12.9 g/dL (ref 12.0–15.0)
MCH: 27.8 pg (ref 26.0–34.0)
MCHC: 33 g/dL (ref 30.0–36.0)
MCV: 84.3 fL (ref 80.0–100.0)
Platelets: 310 10*3/uL (ref 150–400)
RBC: 4.64 MIL/uL (ref 3.87–5.11)
RDW: 12.5 % (ref 11.5–15.5)
WBC: 6.4 10*3/uL (ref 4.0–10.5)
nRBC: 0 % (ref 0.0–0.2)

## 2023-12-22 LAB — LIPASE, BLOOD: Lipase: 33 U/L (ref 11–51)

## 2023-12-22 LAB — HCG, SERUM, QUALITATIVE: Preg, Serum: NEGATIVE

## 2023-12-22 NOTE — ED Triage Notes (Signed)
 Pt has her newborn baby with her in triage. No caregiver available. She will be waiting in car. Can be reached at [phone]

## 2023-12-22 NOTE — ED Triage Notes (Signed)
 Pt reports left sided abdominal pain that started last Sunday and has been constant since. No NVD. No urinary s/s. No hx of GI problems. Per note pt has a vignal birth without complications approx 23 months ago. Currently breastfeeding.

## 2023-12-23 ENCOUNTER — Emergency Department (HOSPITAL_COMMUNITY)

## 2023-12-23 LAB — URINALYSIS, ROUTINE W REFLEX MICROSCOPIC
Bacteria, UA: NONE SEEN
Bilirubin Urine: NEGATIVE
Glucose, UA: NEGATIVE mg/dL
Hgb urine dipstick: NEGATIVE
Ketones, ur: NEGATIVE mg/dL
Nitrite: NEGATIVE
Protein, ur: 30 mg/dL — AB
Specific Gravity, Urine: 1.02 (ref 1.005–1.030)
pH: 6 (ref 5.0–8.0)

## 2023-12-23 MED ORDER — NAPROXEN 500 MG PO TABS
500.0000 mg | ORAL_TABLET | Freq: Once | ORAL | Status: AC
  Administered 2023-12-23: 500 mg via ORAL
  Filled 2023-12-23: qty 1

## 2023-12-23 NOTE — ED Provider Notes (Signed)
 Accepted handoff at shift change from Gilliam PA-C. Please see prior provider note for full HPI.  Briefly: Patient is a 28 y.o. female who presents to the ER for abdominal pain for the past week.  She has been having intermittent cramping in her lower abdomen.  Had an uncomplicated vaginal delivery of newborn girl in February of this year.   DDX/Plan: Follow-up on pelvic ultrasound.  Anticipate if normal patient can discharge home with OB/GYN follow-up.  Physical Exam  BP (!) 142/88   Pulse (!) 102   Temp 98.4 F (36.9 C)   Resp 12   Ht 5\' 3"  (1.6 m)   Wt 98.9 kg   SpO2 99%   BMI 38.62 kg/m   Physical Exam Vitals and nursing note reviewed.  Constitutional:      Appearance: Normal appearance.  HENT:     Head: Normocephalic and atraumatic.  Eyes:     Conjunctiva/sclera: Conjunctivae normal.  Pulmonary:     Effort: Pulmonary effort is normal. No respiratory distress.  Skin:    General: Skin is warm and dry.  Neurological:     Mental Status: She is alert.  Psychiatric:        Mood and Affect: Mood normal.        Behavior: Behavior normal.    Results  US  Pelvis Complete Result Date: 12/23/2023 CLINICAL DATA:  Lower abdominal pain for 1 week EXAM: TRANSABDOMINAL ULTRASOUND OF PELVIS DOPPLER ULTRASOUND OF OVARIES TECHNIQUE: Transabdominal ultrasound examination of the pelvis was performed including evaluation of the uterus, ovaries, adnexal regions, and pelvic cul-de-sac. Color and duplex Doppler ultrasound was utilized to evaluate blood flow to the ovaries. COMPARISON:  None Available. FINDINGS: Uterus Measurements: 9 x 4 x 6 cm = volume: 130 mL. Intramural hypoechoic area left of midline at the lower uterine segment measuring 2.8 cm, difficult to distinguish between cystic and hypoechoic solid. Endometrium Thickness: 8 mm.  No focal abnormality visualized. Right ovary Measurements: 42 x 29 x 37 mm = volume: 23 mL. Normal appearance/no adnexal mass. Dominant follicle on the side Left  ovary Measurements: 36 x 18 x 24 mm = volume: 8 mL. Normal appearance/no adnexal mass. Pulsed Doppler evaluation demonstrates arterial and venous waveforms. The left ovarian waveform is more high resistance, and could be an adjacent vessel, but no morphologic changes of torsion. IMPRESSION: Normal ultrasound of the ovaries. 2.8 cm intramural mass at the lower uterine segment or cervix, usually fibroid or nabothian cyst but notable for age and gynecologic follow-up is recommended. Electronically Signed   By: Ronnette Coke M.D.   On: 12/23/2023 07:16   US  PELVIC DOPPLER (TORSION R/O OR MASS ARTERIAL FLOW) Result Date: 12/23/2023 CLINICAL DATA:  Lower abdominal pain for 1 week EXAM: TRANSABDOMINAL ULTRASOUND OF PELVIS DOPPLER ULTRASOUND OF OVARIES TECHNIQUE: Transabdominal ultrasound examination of the pelvis was performed including evaluation of the uterus, ovaries, adnexal regions, and pelvic cul-de-sac. Color and duplex Doppler ultrasound was utilized to evaluate blood flow to the ovaries. COMPARISON:  None Available. FINDINGS: Uterus Measurements: 9 x 4 x 6 cm = volume: 130 mL. Intramural hypoechoic area left of midline at the lower uterine segment measuring 2.8 cm, difficult to distinguish between cystic and hypoechoic solid. Endometrium Thickness: 8 mm.  No focal abnormality visualized. Right ovary Measurements: 42 x 29 x 37 mm = volume: 23 mL. Normal appearance/no adnexal mass. Dominant follicle on the side Left ovary Measurements: 36 x 18 x 24 mm = volume: 8 mL. Normal appearance/no adnexal mass. Pulsed Doppler  evaluation demonstrates arterial and venous waveforms. The left ovarian waveform is more high resistance, and could be an adjacent vessel, but no morphologic changes of torsion. IMPRESSION: Normal ultrasound of the ovaries. 2.8 cm intramural mass at the lower uterine segment or cervix, usually fibroid or nabothian cyst but notable for age and gynecologic follow-up is recommended. Electronically  Signed   By: Ronnette Coke M.D.   On: 12/23/2023 07:16    ED Course / MDM   Clinical Course as of 12/23/23 0649  Sat Dec 23, 2023  0500 Discussed with patient there will be a significant point for ultrasound tech due to unavailability, I will ask for patient shared decision making.  She states that she is okay with waiting. [JG]    Clinical Course User Index [JG] Juanetta Nordmann, PA   Medical Decision Making Amount and/or Complexity of Data Reviewed Labs: ordered. Radiology: ordered.  Ultrasound shows 2.8 cm intramural mass at the lower uterine segment or cervix, favoring either fibroid or nabothian cyst.  Reviewed the results with the patient.  Symptoms are stable, no worsening during unfortunate 10-hour wait in the ER.  We discussed that she should follow-up with an OB/GYN, and recommended over-the-counter medications for symptom control in meantime.  Patient is agreeable to this plan and all questions answered.  She is stable for discharge.   Jaheem Hedgepath T, PA-C 12/23/23 1610    Arvilla Birmingham, MD 12/23/23 [phone]

## 2023-12-23 NOTE — ED Provider Notes (Signed)
 [facility] Provider Note   CSN: [id number] Arrival date & time: 12/22/23  2146     History Chief Complaint  Patient presents with   Abdominal Pain     Abdominal Pain Associated symptoms: no chills, no diarrhea, no fever, no nausea and no vomiting    Cindy Middleton is a 28 y.o. female presenting for a 1 week history of persistent abdominal discomfort that she describes as persistent sensation of pressure in her lower abdomen along with intermittent cramping.  She denies any nausea, vomiting, diarrhea and also denies any vaginal discharge or bleeding.  Of note she had an uncomplicated vaginal delivery of a newborn girl on February of this year.  The only thing of note with her pregnancy was a history of gestational diabetes which led to a new diagnosis of type 2 diabetes.  She does not take any medication for her diabetes opting for strict diet control at this time.  She has not taken any medications for her abdominal pain since onset denies any aggravating or palliating factors.  She has had normal bowel movements nearly daily, and denies any dysuria.  She notes that her primary concern for coming in tonight is that she was Internet searching her symptoms and had concern over potential for serious complications, including malignancy.  There is the fact that she states she has not food from an [place] market about a week ago approximately at the time of onset of symptoms.   Patient's recorded medical, surgical, social, medication list and allergies were reviewed in the Snapshot window as part of the initial history.   Review of Systems   Review of Systems  Constitutional:  Negative for chills and fever.  Gastrointestinal:  Positive for abdominal pain. Negative for abdominal distention, diarrhea, nausea and vomiting.    Physical Exam Updated Vital Signs BP (!) 142/88   Pulse (!) 102   Temp 98.4 F (36.9 C)   Resp 12   Ht 5\' 3"  (1.6 m)   Wt  98.9 kg   SpO2 99%   BMI 38.62 kg/m  Physical Exam Vitals and nursing note reviewed.  Constitutional:      General: She is not in acute distress.    Appearance: Normal appearance.  HENT:     Head: Normocephalic and atraumatic.     Mouth/Throat:     Mouth: Mucous membranes are moist.     Pharynx: Oropharynx is clear.  Eyes:     Extraocular Movements: Extraocular movements intact.     Conjunctiva/sclera: Conjunctivae normal.     Pupils: Pupils are equal, round, and reactive to light.  Cardiovascular:     Rate and Rhythm: Normal rate and regular rhythm.     Pulses: Normal pulses.     Heart sounds: Normal heart sounds. No murmur heard.    No friction rub. No gallop.  Pulmonary:     Effort: Pulmonary effort is normal.     Breath sounds: Normal breath sounds.  Abdominal:     General: Abdomen is flat. Bowel sounds are normal.     Palpations: Abdomen is soft.     Tenderness: There is no abdominal tenderness. There is no guarding or rebound.  Musculoskeletal:        General: Normal range of motion.     Cervical back: Normal range of motion and neck supple.     Right lower leg: No edema.     Left lower leg: No edema.  Skin:  General: Skin is warm and dry.     Capillary Refill: Capillary refill takes less than 2 seconds.  Neurological:     General: No focal deficit present.     Mental Status: She is alert. Mental status is at baseline.  Psychiatric:        Mood and Affect: Mood normal.      ED Course/ Medical Decision Making/ A&P Clinical Course as of 12/23/23 0641  Sat Dec 23, 2023  0500 Discussed with patient there will be a significant point for ultrasound tech due to unavailability, I will ask for patient shared decision making.  She states that she is okay with waiting. [JG]    Clinical Course User Index [JG] Juanetta Nordmann, PA    Procedures Procedures   Medications Ordered in ED Medications - No data to display  Medical Decision Making:   Cindy Middleton  is a 28 y.o. female who presented to the ED today with lower abdominal pain detailed above.     Complete initial physical exam performed, notably the patient  was nontender to the abdomen.    Reviewed and confirmed nursing documentation for past medical history, family history, social history.    Initial Assessment:   With the patient's presentation of lower abdominal pain, most likely diagnosis is gastroenteritis. Other diagnoses were considered including (but not limited to) ectopic pregnancy, bowel obstruction. These are considered less likely due to history of present illness and physical exam findings.     Initial Plan:   Screening labs including CBC and Metabolic panel to evaluate for infectious or metabolic etiology of disease.  Urinalysis with reflex culture ordered to evaluate for UTI or relevant urologic/nephrologic pathology.  Evaluate serum lipase to evaluate for potential pancreatic etiology Objective evaluation as below reviewed   Initial Study Results:   Laboratory  All laboratory results reviewed without evidence of clinically relevant pathology.   Exceptions include: None   Reassessment and Plan:   Physical exam and workup were unremarkable for any acute abdominal pathology.  Urinalysis was unremarkable, despite mild presence of protein and leukocytes, squamous epithelium is noted suggesting potential contaminant.  Pregnancy test is also negative.  Ultrasound is pending.  Sign Out to L. Roemhildt, PA   Likely symptoms are attributed to acute gastroenteritis, possible foodborne.  Clinical Impression: No diagnosis found.   Data Unavailable   Final Clinical Impression(s) / ED Diagnoses Final diagnoses:  None    Rx / DC Orders ED Discharge Orders     None         Juanetta Nordmann, PA 12/23/23 8416    Maralee Senate, April, MD 12/23/23 [phone]

## 2023-12-23 NOTE — Discharge Instructions (Addendum)
 You were seen in the emergency department for abdominal/pelvic pain.  As we discussed your ultrasound looks like you may have a uterine fibroid.  I recommend following up with your OB/GYN about this, and you can discuss possible treatment options.  In the meantime I recommend taking over-the-counter medications like ibuprofen , Tylenol , Aleve to help with some discomfort.  If you are having more intestinal cramping you can take things like Pepto-Bismol.  Continue monitor how you are doing and return to the ER for any new or worsening symptoms.

## 2023-12-27 ENCOUNTER — Emergency Department (HOSPITAL_COMMUNITY)

## 2023-12-27 ENCOUNTER — Encounter (HOSPITAL_COMMUNITY): Payer: Self-pay

## 2023-12-27 ENCOUNTER — Emergency Department (HOSPITAL_COMMUNITY)
Admission: EM | Admit: 2023-12-27 | Discharge: 2023-12-27 | Disposition: A | Discharge status: Home / Self Care | Attending: Emergency Medicine | Admitting: Emergency Medicine

## 2023-12-27 DIAGNOSIS — N83201 Unspecified ovarian cyst, right side: Secondary | ICD-10-CM | POA: Diagnosis not present

## 2023-12-27 DIAGNOSIS — R109 Unspecified abdominal pain: Secondary | ICD-10-CM | POA: Diagnosis present

## 2023-12-27 DIAGNOSIS — D259 Leiomyoma of uterus, unspecified: Secondary | ICD-10-CM | POA: Insufficient documentation

## 2023-12-27 DIAGNOSIS — R1010 Upper abdominal pain, unspecified: Secondary | ICD-10-CM | POA: Insufficient documentation

## 2023-12-27 LAB — COMPREHENSIVE METABOLIC PANEL WITH GFR
ALT: 36 U/L (ref 0–44)
AST: 29 U/L (ref 15–41)
Albumin: 3.9 g/dL (ref 3.5–5.0)
Alkaline Phosphatase: 51 U/L (ref 38–126)
Anion gap: 6 (ref 5–15)
BUN: 12 mg/dL (ref 6–20)
CO2: 26 mmol/L (ref 22–32)
Calcium: 9.4 mg/dL (ref 8.9–10.3)
Chloride: 104 mmol/L (ref 98–111)
Creatinine, Ser: 0.58 mg/dL (ref 0.44–1.00)
GFR, Estimated: >60 mL/min (ref >60)
Glucose, Bld: 88 mg/dL (ref 70–99)
Potassium: 3.7 mmol/L (ref 3.5–5.1)
Sodium: 136 mmol/L (ref 135–145)
Total Bilirubin: 0.9 mg/dL (ref 0.0–1.2)
Total Protein: 6.9 g/dL (ref 6.5–8.1)

## 2023-12-27 LAB — CBC
HCT: 38.3 % (ref 36.0–46.0)
Hemoglobin: 12.4 g/dL (ref 12.0–15.0)
MCH: 27.7 pg (ref 26.0–34.0)
MCHC: 32.4 g/dL (ref 30.0–36.0)
MCV: 85.5 fL (ref 80.0–100.0)
Platelets: 292 10*3/uL (ref 150–400)
RBC: 4.48 MIL/uL (ref 3.87–5.11)
RDW: 12.5 % (ref 11.5–15.5)
WBC: 6.8 10*3/uL (ref 4.0–10.5)
nRBC: 0 % (ref 0.0–0.2)

## 2023-12-27 LAB — LIPASE, BLOOD: Lipase: 34 U/L (ref 11–51)

## 2023-12-27 MED ORDER — SODIUM CHLORIDE (PF) 0.9 % IJ SOLN
INTRAMUSCULAR | Status: AC
  Filled 2023-12-27: qty 50

## 2023-12-27 MED ORDER — LACTATED RINGERS IV BOLUS
1000.0000 mL | Freq: Once | INTRAVENOUS | Status: AC
  Administered 2023-12-27: 1000 mL via INTRAVENOUS

## 2023-12-27 MED ORDER — ACETAMINOPHEN 500 MG PO TABS
1000.0000 mg | ORAL_TABLET | Freq: Once | ORAL | Status: AC
  Administered 2023-12-27: 1000 mg via ORAL
  Filled 2023-12-27: qty 2

## 2023-12-27 MED ORDER — IOHEXOL 300 MG/ML  SOLN
100.0000 mL | Freq: Once | INTRAMUSCULAR | Status: AC | PRN
Start: 2023-12-27 — End: 2023-12-27
  Administered 2023-12-27: 100 mL via INTRAVENOUS

## 2023-12-27 NOTE — ED Triage Notes (Signed)
 Pt presents with c/o abdominal pain that radiates to her back. Pt reports that this has been present for several days and she is worried that she has an infection at her belly button as well. Pt does have her newborn baby with her.

## 2023-12-27 NOTE — Discharge Instructions (Signed)
 It was our pleasure to provide your ER care today - we hope that you feel better.  Your imaging tests show 1. A right ovarian cyst, and 2. A uterine fibroid - see attached info.  Follow up with ob/gyn doctor in the next 1-2 weeks. You may take ibuprofen  or acetaminophen  as need.   Return to ER if worse, new symptoms, fevers, new or worsening or severe abdominal pain, persistent vomiting, or other concern.

## 2023-12-27 NOTE — ED Provider Notes (Signed)
 [place] EMERGENCY DEPARTMENT AT [facility] Provider Note   CSN: [id number] Arrival date & time: 12/27/23  4098     History  Chief Complaint  Patient presents with   Abdominal Pain    Cindy Middleton is a 28 y.o. female.  Pt with c/o upper abdominal pain that occasionally radiates to back in past two weeks. No specific exacerbating or alleviating factors. No fevers. Nausea. No vomiting. No abd distension. Having regular bms, no diarrhea or constipation. No dysuria or hematuria. No vaginal discharge or bleeding. Indicates recent prior eval had pelvic u/s w small fibroid.  No hx pud, pancreatitis, or gallstones.   The history is provided by the patient and medical records.  Abdominal Pain Associated symptoms: no chest pain, no chills, no constipation, no cough, no diarrhea, no dysuria, no fever, no shortness of breath, no sore throat, no vaginal bleeding, no vaginal discharge and no vomiting        Home Medications Prior to Admission medications   Medication Sig Start Date End Date Taking? Authorizing Provider  cholestyramine (QUESTRAN) 4 GM/DOSE powder Take 4 g by mouth 2 (two) times daily.    [provider]  ibuprofen  (ADVIL ) 400 MG tablet Take 1 tablet (400 mg total) by mouth every 6 (six) hours as needed. 10/26/19   Kendrick, Caitlyn S, PA-C  medroxyPROGESTERone  (DEPO-PROVERA ) 150 MG/ML injection Inject 1 mL (150 mg total) into the muscle every 3 (three) months. Patient not taking: Reported on 04/08/2023 04/13/17   Cresenzo-Dishmon, Blanca Bunch, CNM  predniSONE  (DELTASONE ) 10 MG tablet 6 day taper Patient not taking: Reported on 04/08/2023 05/08/18   Pickering, Vrinda, NP  Prenatal Vit-Fe Fumarate-FA (MULTIVITAMIN-PRENATAL) 27-0.8 MG TABS tablet Take 1 tablet by mouth daily. 03/15/23   [provider]      Allergies    Patient has no known allergies.    Review of Systems   Review of Systems  Constitutional:  Negative for chills and fever.  HENT:   Negative for sore throat.   Respiratory:  Negative for cough and shortness of breath.   Cardiovascular:  Negative for chest pain.  Gastrointestinal:  Positive for abdominal pain. Negative for constipation, diarrhea and vomiting.  Genitourinary:  Negative for dysuria, flank pain, vaginal bleeding and vaginal discharge.  Skin:  Negative for rash.  Neurological:  Negative for headaches.    Physical Exam Updated Vital Signs BP (!) 114/53   Pulse 64   Temp 98.3 F (36.8 C) (Oral)   Resp 18   SpO2 100%   Breastfeeding Unknown  Physical Exam Vitals and nursing note reviewed.  Constitutional:      Appearance: Normal appearance. She is well-developed.  HENT:     Head: Atraumatic.     Nose: Nose normal.     Mouth/Throat:     Mouth: Mucous membranes are moist.  Eyes:     General: No scleral icterus.    Conjunctiva/sclera: Conjunctivae normal.  Neck:     Trachea: No tracheal deviation.  Cardiovascular:     Rate and Rhythm: Normal rate and regular rhythm.     Pulses: Normal pulses.     Heart sounds: Normal heart sounds. No murmur heard.    No friction rub. No gallop.  Pulmonary:     Effort: Pulmonary effort is normal. No respiratory distress.     Breath sounds: Normal breath sounds.  Abdominal:     General: Bowel sounds are normal. There is no distension.     Palpations: Abdomen is soft.  Tenderness: There is abdominal tenderness. There is no guarding.     Comments: Epigastric tenderness.   Genitourinary:    Comments: No cva tenderness.  Musculoskeletal:        General: No swelling.     Cervical back: Neck supple. No muscular tenderness.  Skin:    General: Skin is warm and dry.     Findings: No rash.  Neurological:     Mental Status: She is alert.     Comments: Alert, speech normal.   Psychiatric:        Mood and Affect: Mood normal.     ED Results / Procedures / Treatments   Labs (all labs ordered are listed, but only abnormal results are displayed) Results for  orders placed or performed during the hospital encounter of 12/27/23  CBC   Collection Time: 12/27/23 10:53 AM  Result Value Ref Range   WBC 6.8 4.0 - 10.5 K/uL   RBC 4.48 3.87 - 5.11 MIL/uL   Hemoglobin 12.4 12.0 - 15.0 g/dL   HCT 16.1 09.6 - 04.5 %   MCV 85.5 80.0 - 100.0 fL   MCH 27.7 26.0 - 34.0 pg   MCHC 32.4 30.0 - 36.0 g/dL   RDW 40.9 81.1 - 91.4 %   Platelets 292 150 - 400 K/uL   nRBC 0.0 0.0 - 0.2 %  Comprehensive metabolic panel with GFR   Collection Time: 12/27/23 10:53 AM  Result Value Ref Range   Sodium 136 135 - 145 mmol/L   Potassium 3.7 3.5 - 5.1 mmol/L   Chloride 104 98 - 111 mmol/L   CO2 26 22 - 32 mmol/L   Glucose, Bld 88 70 - 99 mg/dL   BUN 12 6 - 20 mg/dL   Creatinine, Ser 7.82 0.44 - 1.00 mg/dL   Calcium 9.4 8.9 - 95.6 mg/dL   Total Protein 6.9 6.5 - 8.1 g/dL   Albumin 3.9 3.5 - 5.0 g/dL   AST 29 15 - 41 U/L   ALT 36 0 - 44 U/L   Alkaline Phosphatase 51 38 - 126 U/L   Total Bilirubin 0.9 0.0 - 1.2 mg/dL   GFR, Estimated >21 >30 mL/min   Anion gap 6 5 - 15  Lipase, blood   Collection Time: 12/27/23 10:53 AM  Result Value Ref Range   Lipase 34 11 - 51 U/L   US  Pelvis Complete Result Date: 12/27/2023 CLINICAL DATA:  Abdominal pain, adnexal cystic lesion on CT EXAM: TRANSABDOMINAL AND TRANSVAGINAL ULTRASOUND OF PELVIS DOPPLER ULTRASOUND OF OVARIES TECHNIQUE: Both transabdominal and transvaginal ultrasound examinations of the pelvis were performed. Transabdominal technique was performed for global imaging of the pelvis including uterus, ovaries, adnexal regions, and pelvic cul-de-sac. It was necessary to proceed with endovaginal exam following the transabdominal exam to visualize the endometrium and adnexal structures. Color and duplex Doppler ultrasound was utilized to evaluate blood flow to the ovaries. COMPARISON:  12/27/2023, 12/23/2023 FINDINGS: Uterus Measurements: 11.0 x 4.4 x 6.1 cm = volume: 152.7 mL. Intramural uterine fibroid within the left aspect  of the lower uterine segment measuring 2.5 x 2.5 x 2.5 cm. Endometrium Thickness: 18 mm.  No focal abnormality visualized. Right ovary Measurements: 4.9 x 4.2 x 4.3 cm = volume: 45.7 mL. 4.6 x 3.4 x 3.8 cm complex cyst identified, with multiple thin internal septations. No internal vascularity. Left ovary Not well visualized. Pulsed Doppler evaluation of the right ovary demonstrates normal low-resistance arterial and venous waveforms. Other findings No abnormal free fluid. IMPRESSION: 1.  Stable 2.5 cm intramural uterine fibroid. 2. 4.6 cm minimally complex cyst within the right ovary, likely a hemorrhagic cyst. Short-interval follow up ultrasound in 6-12 weeks is recommended, preferably during the week following the patient's normal menses. 3. No evidence of right ovarian torsion. 4. Nonvisualization of the left ovary. Normal appearing left ovary was identified on preceding CT exam however. Electronically Signed   By: Bobbye Burrow M.D.   On: 12/27/2023 14:53   US  Transvaginal Non-OB Result Date: 12/27/2023 CLINICAL DATA:  Abdominal pain, adnexal cystic lesion on CT EXAM: TRANSABDOMINAL AND TRANSVAGINAL ULTRASOUND OF PELVIS DOPPLER ULTRASOUND OF OVARIES TECHNIQUE: Both transabdominal and transvaginal ultrasound examinations of the pelvis were performed. Transabdominal technique was performed for global imaging of the pelvis including uterus, ovaries, adnexal regions, and pelvic cul-de-sac. It was necessary to proceed with endovaginal exam following the transabdominal exam to visualize the endometrium and adnexal structures. Color and duplex Doppler ultrasound was utilized to evaluate blood flow to the ovaries. COMPARISON:  12/27/2023, 12/23/2023 FINDINGS: Uterus Measurements: 11.0 x 4.4 x 6.1 cm = volume: 152.7 mL. Intramural uterine fibroid within the left aspect of the lower uterine segment measuring 2.5 x 2.5 x 2.5 cm. Endometrium Thickness: 18 mm.  No focal abnormality visualized. Right ovary Measurements:  4.9 x 4.2 x 4.3 cm = volume: 45.7 mL. 4.6 x 3.4 x 3.8 cm complex cyst identified, with multiple thin internal septations. No internal vascularity. Left ovary Not well visualized. Pulsed Doppler evaluation of the right ovary demonstrates normal low-resistance arterial and venous waveforms. Other findings No abnormal free fluid. IMPRESSION: 1. Stable 2.5 cm intramural uterine fibroid. 2. 4.6 cm minimally complex cyst within the right ovary, likely a hemorrhagic cyst. Short-interval follow up ultrasound in 6-12 weeks is recommended, preferably during the week following the patient's normal menses. 3. No evidence of right ovarian torsion. 4. Nonvisualization of the left ovary. Normal appearing left ovary was identified on preceding CT exam however. Electronically Signed   By: Bobbye Burrow M.D.   On: 12/27/2023 14:53   US  Art/Ven Flow Abd Pelv Doppler Result Date: 12/27/2023 CLINICAL DATA:  Abdominal pain, adnexal cystic lesion on CT EXAM: TRANSABDOMINAL AND TRANSVAGINAL ULTRASOUND OF PELVIS DOPPLER ULTRASOUND OF OVARIES TECHNIQUE: Both transabdominal and transvaginal ultrasound examinations of the pelvis were performed. Transabdominal technique was performed for global imaging of the pelvis including uterus, ovaries, adnexal regions, and pelvic cul-de-sac. It was necessary to proceed with endovaginal exam following the transabdominal exam to visualize the endometrium and adnexal structures. Color and duplex Doppler ultrasound was utilized to evaluate blood flow to the ovaries. COMPARISON:  12/27/2023, 12/23/2023 FINDINGS: Uterus Measurements: 11.0 x 4.4 x 6.1 cm = volume: 152.7 mL. Intramural uterine fibroid within the left aspect of the lower uterine segment measuring 2.5 x 2.5 x 2.5 cm. Endometrium Thickness: 18 mm.  No focal abnormality visualized. Right ovary Measurements: 4.9 x 4.2 x 4.3 cm = volume: 45.7 mL. 4.6 x 3.4 x 3.8 cm complex cyst identified, with multiple thin internal septations. No internal  vascularity. Left ovary Not well visualized. Pulsed Doppler evaluation of the right ovary demonstrates normal low-resistance arterial and venous waveforms. Other findings No abnormal free fluid. IMPRESSION: 1. Stable 2.5 cm intramural uterine fibroid. 2. 4.6 cm minimally complex cyst within the right ovary, likely a hemorrhagic cyst. Short-interval follow up ultrasound in 6-12 weeks is recommended, preferably during the week following the patient's normal menses. 3. No evidence of right ovarian torsion. 4. Nonvisualization of the left ovary. Normal appearing left ovary  was identified on preceding CT exam however. Electronically Signed   By: Bobbye Burrow M.D.   On: 12/27/2023 14:53   CT ABDOMEN PELVIS W CONTRAST Result Date: 12/27/2023 CLINICAL DATA:  Acute generalized abdominal pain. EXAM: CT ABDOMEN AND PELVIS WITH CONTRAST TECHNIQUE: Multidetector CT imaging of the abdomen and pelvis was performed using the standard protocol following bolus administration of intravenous contrast. RADIATION DOSE REDUCTION: This exam was performed according to the departmental dose-optimization program which includes automated exposure control, adjustment of the mA and/or kV according to patient size and/or use of iterative reconstruction technique. CONTRAST:  100mL OMNIPAQUE IOHEXOL 300 MG/ML  SOLN COMPARISON:  Ultrasound of same day. FINDINGS: Lower chest: No acute abnormality. Hepatobiliary: No focal liver abnormality is seen. No gallstones, gallbladder wall thickening, or biliary dilatation. Pancreas: Unremarkable. No pancreatic ductal dilatation or surrounding inflammatory changes. Spleen: Normal in size without focal abnormality. Adrenals/Urinary Tract: Adrenal glands are unremarkable. Kidneys are normal, without renal calculi, focal lesion, or hydronephrosis. Bladder is unremarkable. Stomach/Bowel: Stomach is within normal limits. Appendix appears normal. No evidence of bowel wall thickening, distention, or inflammatory  changes. Vascular/Lymphatic: No significant vascular findings are present. No enlarged abdominal or pelvic lymph nodes. Reproductive: Uterus is unremarkable. 4.5 cm complex right adnexal cyst is noted. Other: No abdominal wall hernia or abnormality. No abdominopelvic ascites. Musculoskeletal: No acute or significant osseous findings. IMPRESSION: 4.5 cm complex right adnexal cystic lesion. Pelvic ultrasound is recommended for further evaluation. Electronically Signed   By: Rosalene Colon M.D.   On: 12/27/2023 12:49   US  Abdomen Limited RUQ (LIVER/GB) Result Date: 12/27/2023 CLINICAL DATA:  Abdominal pain EXAM: ULTRASOUND ABDOMEN LIMITED RIGHT UPPER QUADRANT COMPARISON:  None Available. FINDINGS: Gallbladder: No gallstones or wall thickening visualized. No sonographic Murphy sign noted by sonographer. Common bile duct: Diameter: 3 mm Liver: Mildly echogenic hepatic parenchyma consistent with fatty liver infiltration. Portal vein is patent on color Doppler imaging with normal direction of blood flow towards the liver. Other: None. IMPRESSION: Mild fatty liver infiltration.  No gallstones or ductal dilatation. Electronically Signed   By: Adrianna Horde M.D.   On: 12/27/2023 10:54   US  Pelvis Complete Result Date: 12/23/2023 CLINICAL DATA:  Lower abdominal pain for 1 week EXAM: TRANSABDOMINAL ULTRASOUND OF PELVIS DOPPLER ULTRASOUND OF OVARIES TECHNIQUE: Transabdominal ultrasound examination of the pelvis was performed including evaluation of the uterus, ovaries, adnexal regions, and pelvic cul-de-sac. Color and duplex Doppler ultrasound was utilized to evaluate blood flow to the ovaries. COMPARISON:  None Available. FINDINGS: Uterus Measurements: 9 x 4 x 6 cm = volume: 130 mL. Intramural hypoechoic area left of midline at the lower uterine segment measuring 2.8 cm, difficult to distinguish between cystic and hypoechoic solid. Endometrium Thickness: 8 mm.  No focal abnormality visualized. Right ovary Measurements: 42  x 29 x 37 mm = volume: 23 mL. Normal appearance/no adnexal mass. Dominant follicle on the side Left ovary Measurements: 36 x 18 x 24 mm = volume: 8 mL. Normal appearance/no adnexal mass. Pulsed Doppler evaluation demonstrates arterial and venous waveforms. The left ovarian waveform is more high resistance, and could be an adjacent vessel, but no morphologic changes of torsion. IMPRESSION: Normal ultrasound of the ovaries. 2.8 cm intramural mass at the lower uterine segment or cervix, usually fibroid or nabothian cyst but notable for age and gynecologic follow-up is recommended. Electronically Signed   By: Ronnette Coke M.D.   On: 12/23/2023 07:16   US  PELVIC DOPPLER (TORSION R/O OR MASS ARTERIAL FLOW) Result Date:  12/23/2023 CLINICAL DATA:  Lower abdominal pain for 1 week EXAM: TRANSABDOMINAL ULTRASOUND OF PELVIS DOPPLER ULTRASOUND OF OVARIES TECHNIQUE: Transabdominal ultrasound examination of the pelvis was performed including evaluation of the uterus, ovaries, adnexal regions, and pelvic cul-de-sac. Color and duplex Doppler ultrasound was utilized to evaluate blood flow to the ovaries. COMPARISON:  None Available. FINDINGS: Uterus Measurements: 9 x 4 x 6 cm = volume: 130 mL. Intramural hypoechoic area left of midline at the lower uterine segment measuring 2.8 cm, difficult to distinguish between cystic and hypoechoic solid. Endometrium Thickness: 8 mm.  No focal abnormality visualized. Right ovary Measurements: 42 x 29 x 37 mm = volume: 23 mL. Normal appearance/no adnexal mass. Dominant follicle on the side Left ovary Measurements: 36 x 18 x 24 mm = volume: 8 mL. Normal appearance/no adnexal mass. Pulsed Doppler evaluation demonstrates arterial and venous waveforms. The left ovarian waveform is more high resistance, and could be an adjacent vessel, but no morphologic changes of torsion. IMPRESSION: Normal ultrasound of the ovaries. 2.8 cm intramural mass at the lower uterine segment or cervix, usually fibroid  or nabothian cyst but notable for age and gynecologic follow-up is recommended. Electronically Signed   By: Ronnette Coke M.D.   On: 12/23/2023 07:16    EKG None  Radiology US  Pelvis Complete Result Date: 12/27/2023 CLINICAL DATA:  Abdominal pain, adnexal cystic lesion on CT EXAM: TRANSABDOMINAL AND TRANSVAGINAL ULTRASOUND OF PELVIS DOPPLER ULTRASOUND OF OVARIES TECHNIQUE: Both transabdominal and transvaginal ultrasound examinations of the pelvis were performed. Transabdominal technique was performed for global imaging of the pelvis including uterus, ovaries, adnexal regions, and pelvic cul-de-sac. It was necessary to proceed with endovaginal exam following the transabdominal exam to visualize the endometrium and adnexal structures. Color and duplex Doppler ultrasound was utilized to evaluate blood flow to the ovaries. COMPARISON:  12/27/2023, 12/23/2023 FINDINGS: Uterus Measurements: 11.0 x 4.4 x 6.1 cm = volume: 152.7 mL. Intramural uterine fibroid within the left aspect of the lower uterine segment measuring 2.5 x 2.5 x 2.5 cm. Endometrium Thickness: 18 mm.  No focal abnormality visualized. Right ovary Measurements: 4.9 x 4.2 x 4.3 cm = volume: 45.7 mL. 4.6 x 3.4 x 3.8 cm complex cyst identified, with multiple thin internal septations. No internal vascularity. Left ovary Not well visualized. Pulsed Doppler evaluation of the right ovary demonstrates normal low-resistance arterial and venous waveforms. Other findings No abnormal free fluid. IMPRESSION: 1. Stable 2.5 cm intramural uterine fibroid. 2. 4.6 cm minimally complex cyst within the right ovary, likely a hemorrhagic cyst. Short-interval follow up ultrasound in 6-12 weeks is recommended, preferably during the week following the patient's normal menses. 3. No evidence of right ovarian torsion. 4. Nonvisualization of the left ovary. Normal appearing left ovary was identified on preceding CT exam however. Electronically Signed   By: Bobbye Burrow M.D.    On: 12/27/2023 14:53   US  Transvaginal Non-OB Result Date: 12/27/2023 CLINICAL DATA:  Abdominal pain, adnexal cystic lesion on CT EXAM: TRANSABDOMINAL AND TRANSVAGINAL ULTRASOUND OF PELVIS DOPPLER ULTRASOUND OF OVARIES TECHNIQUE: Both transabdominal and transvaginal ultrasound examinations of the pelvis were performed. Transabdominal technique was performed for global imaging of the pelvis including uterus, ovaries, adnexal regions, and pelvic cul-de-sac. It was necessary to proceed with endovaginal exam following the transabdominal exam to visualize the endometrium and adnexal structures. Color and duplex Doppler ultrasound was utilized to evaluate blood flow to the ovaries. COMPARISON:  12/27/2023, 12/23/2023 FINDINGS: Uterus Measurements: 11.0 x 4.4 x 6.1 cm = volume: 152.7 mL. Intramural  uterine fibroid within the left aspect of the lower uterine segment measuring 2.5 x 2.5 x 2.5 cm. Endometrium Thickness: 18 mm.  No focal abnormality visualized. Right ovary Measurements: 4.9 x 4.2 x 4.3 cm = volume: 45.7 mL. 4.6 x 3.4 x 3.8 cm complex cyst identified, with multiple thin internal septations. No internal vascularity. Left ovary Not well visualized. Pulsed Doppler evaluation of the right ovary demonstrates normal low-resistance arterial and venous waveforms. Other findings No abnormal free fluid. IMPRESSION: 1. Stable 2.5 cm intramural uterine fibroid. 2. 4.6 cm minimally complex cyst within the right ovary, likely a hemorrhagic cyst. Short-interval follow up ultrasound in 6-12 weeks is recommended, preferably during the week following the patient's normal menses. 3. No evidence of right ovarian torsion. 4. Nonvisualization of the left ovary. Normal appearing left ovary was identified on preceding CT exam however. Electronically Signed   By: Bobbye Burrow M.D.   On: 12/27/2023 14:53   US  Art/Ven Flow Abd Pelv Doppler Result Date: 12/27/2023 CLINICAL DATA:  Abdominal pain, adnexal cystic lesion on CT EXAM:  TRANSABDOMINAL AND TRANSVAGINAL ULTRASOUND OF PELVIS DOPPLER ULTRASOUND OF OVARIES TECHNIQUE: Both transabdominal and transvaginal ultrasound examinations of the pelvis were performed. Transabdominal technique was performed for global imaging of the pelvis including uterus, ovaries, adnexal regions, and pelvic cul-de-sac. It was necessary to proceed with endovaginal exam following the transabdominal exam to visualize the endometrium and adnexal structures. Color and duplex Doppler ultrasound was utilized to evaluate blood flow to the ovaries. COMPARISON:  12/27/2023, 12/23/2023 FINDINGS: Uterus Measurements: 11.0 x 4.4 x 6.1 cm = volume: 152.7 mL. Intramural uterine fibroid within the left aspect of the lower uterine segment measuring 2.5 x 2.5 x 2.5 cm. Endometrium Thickness: 18 mm.  No focal abnormality visualized. Right ovary Measurements: 4.9 x 4.2 x 4.3 cm = volume: 45.7 mL. 4.6 x 3.4 x 3.8 cm complex cyst identified, with multiple thin internal septations. No internal vascularity. Left ovary Not well visualized. Pulsed Doppler evaluation of the right ovary demonstrates normal low-resistance arterial and venous waveforms. Other findings No abnormal free fluid. IMPRESSION: 1. Stable 2.5 cm intramural uterine fibroid. 2. 4.6 cm minimally complex cyst within the right ovary, likely a hemorrhagic cyst. Short-interval follow up ultrasound in 6-12 weeks is recommended, preferably during the week following the patient's normal menses. 3. No evidence of right ovarian torsion. 4. Nonvisualization of the left ovary. Normal appearing left ovary was identified on preceding CT exam however. Electronically Signed   By: Bobbye Burrow M.D.   On: 12/27/2023 14:53   CT ABDOMEN PELVIS W CONTRAST Result Date: 12/27/2023 CLINICAL DATA:  Acute generalized abdominal pain. EXAM: CT ABDOMEN AND PELVIS WITH CONTRAST TECHNIQUE: Multidetector CT imaging of the abdomen and pelvis was performed using the standard protocol following  bolus administration of intravenous contrast. RADIATION DOSE REDUCTION: This exam was performed according to the departmental dose-optimization program which includes automated exposure control, adjustment of the mA and/or kV according to patient size and/or use of iterative reconstruction technique. CONTRAST:  100mL OMNIPAQUE IOHEXOL 300 MG/ML  SOLN COMPARISON:  Ultrasound of same day. FINDINGS: Lower chest: No acute abnormality. Hepatobiliary: No focal liver abnormality is seen. No gallstones, gallbladder wall thickening, or biliary dilatation. Pancreas: Unremarkable. No pancreatic ductal dilatation or surrounding inflammatory changes. Spleen: Normal in size without focal abnormality. Adrenals/Urinary Tract: Adrenal glands are unremarkable. Kidneys are normal, without renal calculi, focal lesion, or hydronephrosis. Bladder is unremarkable. Stomach/Bowel: Stomach is within normal limits. Appendix appears normal. No evidence of bowel wall  thickening, distention, or inflammatory changes. Vascular/Lymphatic: No significant vascular findings are present. No enlarged abdominal or pelvic lymph nodes. Reproductive: Uterus is unremarkable. 4.5 cm complex right adnexal cyst is noted. Other: No abdominal wall hernia or abnormality. No abdominopelvic ascites. Musculoskeletal: No acute or significant osseous findings. IMPRESSION: 4.5 cm complex right adnexal cystic lesion. Pelvic ultrasound is recommended for further evaluation. Electronically Signed   By: Rosalene Colon M.D.   On: 12/27/2023 12:49   US  Abdomen Limited RUQ (LIVER/GB) Result Date: 12/27/2023 CLINICAL DATA:  Abdominal pain EXAM: ULTRASOUND ABDOMEN LIMITED RIGHT UPPER QUADRANT COMPARISON:  None Available. FINDINGS: Gallbladder: No gallstones or wall thickening visualized. No sonographic Murphy sign noted by sonographer. Common bile duct: Diameter: 3 mm Liver: Mildly echogenic hepatic parenchyma consistent with fatty liver infiltration. Portal vein is patent  on color Doppler imaging with normal direction of blood flow towards the liver. Other: None. IMPRESSION: Mild fatty liver infiltration.  No gallstones or ductal dilatation. Electronically Signed   By: Adrianna Horde M.D.   On: 12/27/2023 10:54    Procedures Procedures    Medications Ordered in ED Medications  lactated ringers  bolus 1,000 mL (0 mLs Intravenous Stopped 12/27/23 1308)  iohexol (OMNIPAQUE) 300 MG/ML solution 100 mL (100 mLs Intravenous Contrast Given 12/27/23 1226)    ED Course/ Medical Decision Making/ A&P                                 Medical Decision Making Problems Addressed: Right ovarian cyst: acute illness or injury with systemic symptoms Upper abdominal pain: acute illness or injury with systemic symptoms that poses a threat to life or bodily functions Uterine leiomyoma, unspecified location: chronic illness or injury  Amount and/or Complexity of Data Reviewed External Data Reviewed: notes. Labs: ordered. Decision-making details documented in ED Course. Radiology: ordered and independent interpretation performed. Decision-making details documented in ED Course.  Risk OTC drugs. Prescription drug management. Decision regarding hospitalization.   Labs ordered/sent. Imaging ordered.   Differential diagnosis includes gallstones, gastritis, pancreatitis, etc. Dispo decision including potential need for admission considered - will get labs and imaging and reassess.   Reviewed nursing notes and prior charts for additional history. External reports reviewed.  Recent labs reviewed - u preg neg, ua neg.   LR bolus.   Labs reviewed/interpreted by me - wbc and hgb normal. Chem normal.   U/S reviewed/interpreted by me - no gallstones. +ovarian cyst. No torsion.   CT reviewed/interpreted by me - right adnexal lesion ?cyst.  Acetaminophen  po. Po fluids. (Pt breastfeeding, will avoid stronger/narcotic typ meds).  Abd is soft non tender.   Pt appears stable for  d/c.  Rec close pcp/gyn  f/u.  Return precautions provided.          Final Clinical Impression(s) / ED Diagnoses Final diagnoses:  None    Rx / DC Orders ED Discharge Orders     None         Guadalupe Lee, MD 12/27/23 1512

## 2024-08-13 ENCOUNTER — Other Ambulatory Visit: Payer: Self-pay

## 2024-08-13 ENCOUNTER — Emergency Department (HOSPITAL_COMMUNITY)
Admission: EM | Admit: 2024-08-13 | Discharge: 2024-08-13 | Disposition: A | Discharge status: Home / Self Care | Attending: Emergency Medicine | Admitting: Emergency Medicine

## 2024-08-13 DIAGNOSIS — R519 Headache, unspecified: Secondary | ICD-10-CM | POA: Diagnosis not present

## 2024-08-13 DIAGNOSIS — M791 Myalgia, unspecified site: Secondary | ICD-10-CM | POA: Diagnosis present

## 2024-08-13 DIAGNOSIS — B349 Viral infection, unspecified: Secondary | ICD-10-CM

## 2024-08-13 LAB — CBC
HCT: 36.2 % (ref 36.0–46.0)
Hemoglobin: 11.9 g/dL — ABNORMAL LOW (ref 12.0–15.0)
MCH: 27.4 pg (ref 26.0–34.0)
MCHC: 32.9 g/dL (ref 30.0–36.0)
MCV: 83.4 fL (ref 80.0–100.0)
Platelets: 224 K/uL (ref 150–400)
RBC: 4.34 MIL/uL (ref 3.87–5.11)
RDW: 12.2 % (ref 11.5–15.5)
WBC: 10 K/uL (ref 4.0–10.5)
nRBC: 0 % (ref 0.0–0.2)

## 2024-08-13 LAB — RESP PANEL BY RT-PCR (RSV, FLU A&B, COVID)  RVPGX2
Influenza A by PCR: NEGATIVE
Influenza B by PCR: NEGATIVE
Resp Syncytial Virus by PCR: NEGATIVE
SARS Coronavirus 2 by RT PCR: NEGATIVE

## 2024-08-13 LAB — BASIC METABOLIC PANEL WITH GFR
Anion gap: 10 (ref 5–15)
BUN: 11 mg/dL (ref 6–20)
CO2: 25 mmol/L (ref 22–32)
Calcium: 9.1 mg/dL (ref 8.9–10.3)
Chloride: 101 mmol/L (ref 98–111)
Creatinine, Ser: 0.51 mg/dL (ref 0.44–1.00)
GFR, Estimated: >60 mL/min (ref >60)
Glucose, Bld: 139 mg/dL — ABNORMAL HIGH (ref 70–99)
Potassium: 3.9 mmol/L (ref 3.5–5.1)
Sodium: 136 mmol/L (ref 135–145)

## 2024-08-13 LAB — HCG, SERUM, QUALITATIVE: Preg, Serum: NEGATIVE

## 2024-08-13 MED ORDER — DIPHENHYDRAMINE HCL 50 MG/ML IJ SOLN
12.5000 mg | Freq: Once | INTRAMUSCULAR | Status: AC
  Administered 2024-08-13: 20:00:00 12.5 mg via INTRAVENOUS
  Filled 2024-08-13: qty 1

## 2024-08-13 MED ORDER — SODIUM CHLORIDE 0.9 % IV BOLUS
1000.0000 mL | Freq: Once | INTRAVENOUS | Status: AC
  Administered 2024-08-13: 22:00:00 1000 mL via INTRAVENOUS

## 2024-08-13 MED ORDER — SODIUM CHLORIDE 0.9 % IV BOLUS
1000.0000 mL | Freq: Once | INTRAVENOUS | Status: AC
  Administered 2024-08-13: 20:00:00 1000 mL via INTRAVENOUS

## 2024-08-13 MED ORDER — ACETAMINOPHEN 325 MG PO TABS
650.0000 mg | ORAL_TABLET | Freq: Once | ORAL | Status: AC | PRN
  Administered 2024-08-13: 18:00:00 650 mg via ORAL
  Filled 2024-08-13: qty 2

## 2024-08-13 MED ORDER — PROCHLORPERAZINE EDISYLATE 10 MG/2ML IJ SOLN
10.0000 mg | Freq: Once | INTRAMUSCULAR | Status: AC
  Administered 2024-08-13: 20:00:00 10 mg via INTRAVENOUS
  Filled 2024-08-13: qty 2

## 2024-08-13 NOTE — ED Provider Triage Note (Cosign Needed)
 Emergency Medicine Provider Triage Evaluation Note  Cindy Middleton , a 28 y.o. female  was evaluated in triage.  Pt complains of headache and generalized bodyaches along with nasal congestion.  Started Friday but has been persistent.  Has some pain in her neck but states the range of motion of neck is normal endorses subjective fever at home.  Review of Systems  Positive: See above Negative: See above  Physical Exam  BP (!) 153/89 (BP Location: Left Arm)   Pulse (!) 123   Temp 99.8 F (37.7 C) (Oral)   Resp 18   LMP 08/12/2024 (Exact Date)   SpO2 100%  Gen:   Awake, no distress   Resp:  Normal effort  MSK:   Moves extremities without difficulty  Other:    Medical Decision Making  Medically screening exam initiated at 5:22 PM.  Appropriate orders placed.  Cindy Middleton was informed that the remainder of the evaluation will be completed by another provider, this initial triage assessment does not replace that evaluation, and the importance of remaining in the ED until their evaluation is complete.  Work up started   [employer], PA-C 08/13/24 1723

## 2024-08-13 NOTE — Discharge Instructions (Addendum)
 Evaluation today was overall reassuring.  Suspect this is likely a viral illness.  As we discussed recommend continue assertive hydration with water and Gatorade at home.  He can also take Tylenol  and ibuprofen  as you need it.  If your symptoms worsen or develop a fever or any other concerning symptom please return to the ED for further evaluation.

## 2024-08-13 NOTE — ED Notes (Signed)
 Patient received fluids and stated she felt better. DC instructions stated to continue hydration at home. Patient is agreeable.

## 2024-08-13 NOTE — ED Triage Notes (Signed)
 PT arrives with complaints of a headache, neck pain, and generalized body aches that began on Friday and has persisted. Endorses runny nose, denies cough. Pt concerned that her headache was not relieved with acetaminophen  and ibuprofen .

## 2024-08-13 NOTE — ED Provider Notes (Cosign Needed)
 [place] EMERGENCY DEPARTMENT AT [facility] Provider Note   CSN: [id number] Arrival date & time: 08/13/24  1637     Patient presents with: Headache and Generalized Body Aches  HPI Cindy Middleton is a 28 y.o. female presenting for headache and generalized bodyaches along with nasal congestion.  Started Friday but has been persistent.  Has some pain in her neck but states the range of motion of neck is normal endorses subjective fever at home. Denies chest pain and shortness of breath.     Headache      Prior to Admission medications  Medication Sig Start Date End Date Taking? Authorizing Provider  cholestyramine (QUESTRAN) 4 GM/DOSE powder Take 4 g by mouth 2 (two) times daily.    [provider]  ibuprofen  (ADVIL ) 400 MG tablet Take 1 tablet (400 mg total) by mouth every 6 (six) hours as needed. 10/26/19   Kendrick, Caitlyn S, PA-C  medroxyPROGESTERone  (DEPO-PROVERA ) 150 MG/ML injection Inject 1 mL (150 mg total) into the muscle every 3 (three) months. Patient not taking: Reported on 04/08/2023 04/13/17   Cresenzo-Dishmon, Cathlean, CNM  predniSONE  (DELTASONE ) 10 MG tablet 6 day taper Patient not taking: Reported on 04/08/2023 05/08/18   Pickering, Vrinda, NP  Prenatal Vit-Fe Fumarate-FA (MULTIVITAMIN-PRENATAL) 27-0.8 MG TABS tablet Take 1 tablet by mouth daily. 03/15/23   [provider]    Allergies: Patient has no known allergies.    Review of Systems  Neurological:  Positive for headaches.    Updated Vital Signs BP 122/85 (BP Location: Left Arm)   Pulse (!) 111   Temp 99.4 F (37.4 C) (Oral)   Resp 16   LMP 08/12/2024 (Exact Date)   SpO2 100%   Physical Exam Vitals and nursing note reviewed.  HENT:     Head: Normocephalic and atraumatic.     Mouth/Throat:     Mouth: Mucous membranes are moist.  Eyes:     General:        Right eye: No discharge.        Left eye: No discharge.     Conjunctiva/sclera: Conjunctivae normal.   Cardiovascular:     Rate and Rhythm: Regular rhythm. Tachycardia present.     Pulses: Normal pulses.     Heart sounds: Normal heart sounds.  Pulmonary:     Effort: Pulmonary effort is normal.     Breath sounds: Normal breath sounds.  Abdominal:     General: Abdomen is flat.     Palpations: Abdomen is soft.  Musculoskeletal:     Cervical back: Full passive range of motion without pain, normal range of motion and neck supple. Normal range of motion.  Skin:    General: Skin is warm and dry.  Neurological:     General: No focal deficit present.     Comments: GCS 15. Speech is goal oriented. No deficits appreciated to CN III-XII; symmetric eyebrow raise, no facial drooping, tongue midline. Patient has equal grip strength bilaterally with 5/5 strength against resistance in all major muscle groups bilaterally. Sensation to light touch intact. Patient moves extremities without ataxia. Normal finger-nose-finger. Patient ambulatory with steady gait.  Psychiatric:        Mood and Affect: Mood normal.     (all labs ordered are listed, but only abnormal results are displayed) Labs Reviewed  BASIC METABOLIC PANEL WITH GFR - Abnormal; Notable for the following components:      Result Value   Glucose, Bld 139 (*)    All other components  within normal limits  CBC - Abnormal; Notable for the following components:   Hemoglobin 11.9 (*)    All other components within normal limits  RESP PANEL BY RT-PCR (RSV, FLU A&B, COVID)  RVPGX2  HCG, SERUM, QUALITATIVE    EKG: None  Radiology: No results found.   Procedures   Medications Ordered in the ED  sodium chloride  0.9 % bolus 1,000 mL (has no administration in time range)  acetaminophen  (TYLENOL ) tablet 650 mg (650 mg Oral Given 08/13/24 1812)  sodium chloride  0.9 % bolus 1,000 mL (1,000 mLs Intravenous New Bag/Given 08/13/24 2026)  prochlorperazine  (COMPAZINE ) injection 10 mg (10 mg Intravenous Given 08/13/24 2027)  diphenhydrAMINE   (BENADRYL ) injection 12.5 mg (12.5 mg Intravenous Given 08/13/24 2026)                                    Medical Decision Making Amount and/or Complexity of Data Reviewed Labs: ordered.  Risk OTC drugs. Prescription drug management.   Initial Impression and Ddx 28 year old well-appearing female presenting for headache generalized body aches and nasal congestion.  Exam notable for tachycardia but otherwise reassuring.  DDx includes meningitis, viral illness, electrolyte derangement, pneumonia, other. Patient PMH that increases complexity of ED encounter:  none  Interpretation of Diagnostics - I independent reviewed and interpreted the labs as followed: unremarkable   Patient Reassessment and Ultimate Disposition/Management On reassessment, heart rate had improved somewhat with fluid resuscitation.  She stated she was feeling much better and her headache had resolved.  Suspect this is likely a viral illness.  Considered meningitis but unlikely given lack of meningismal symptoms and resolved headache after treatment along with reassuring labs.  Advised continued assertive hydration at home as her tachycardia is likely secondary to dehydration last couple days.  Advised her to follow-up with her PCP.  Discussed return precautions.  Discharge.  Patient management required discussion with the following services or consulting groups:  None  Complexity of Problems Addressed Acute complicated illness or Injury  Additional Data Reviewed and Analyzed Further history obtained from: Past medical history and medications listed in the EMR and Prior ED visit notes  Patient Encounter Risk Assessment Consideration of hospitalization      Final diagnoses:  Viral illness    ED Discharge Orders     None          Lang Norleen POUR, PA-C 08/13/24 2146
